# Patient Record
Sex: Male | Born: 1970 | Race: Black or African American | Hispanic: No | Marital: Married | Smoking: Never smoker
Health system: Southern US, Community
[De-identification: ages and names within clinical notes are randomized; demographics above are authoritative.]

## PROBLEM LIST (undated history)

## (undated) DIAGNOSIS — K8020 Calculus of gallbladder without cholecystitis without obstruction: Secondary | ICD-10-CM

## (undated) DIAGNOSIS — K5730 Diverticulosis of large intestine without perforation or abscess without bleeding: Secondary | ICD-10-CM

## (undated) DIAGNOSIS — K6289 Other specified diseases of anus and rectum: Secondary | ICD-10-CM

## (undated) DIAGNOSIS — K2970 Gastritis, unspecified, without bleeding: Secondary | ICD-10-CM

## (undated) DIAGNOSIS — R079 Chest pain, unspecified: Secondary | ICD-10-CM

## (undated) DIAGNOSIS — Z1289 Encounter for screening for malignant neoplasm of other sites: Secondary | ICD-10-CM

## (undated) DIAGNOSIS — K2990 Gastroduodenitis, unspecified, without bleeding: Secondary | ICD-10-CM

## (undated) DIAGNOSIS — G4730 Sleep apnea, unspecified: Secondary | ICD-10-CM

## (undated) DIAGNOSIS — M79609 Pain in unspecified limb: Secondary | ICD-10-CM

## (undated) DIAGNOSIS — F411 Generalized anxiety disorder: Secondary | ICD-10-CM

## (undated) DIAGNOSIS — D249 Benign neoplasm of unspecified breast: Secondary | ICD-10-CM

## (undated) DIAGNOSIS — E669 Obesity, unspecified: Secondary | ICD-10-CM

## (undated) DIAGNOSIS — M25519 Pain in unspecified shoulder: Secondary | ICD-10-CM

## (undated) DIAGNOSIS — R109 Unspecified abdominal pain: Secondary | ICD-10-CM

## (undated) DIAGNOSIS — J309 Allergic rhinitis, unspecified: Secondary | ICD-10-CM

## (undated) DIAGNOSIS — J069 Acute upper respiratory infection, unspecified: Secondary | ICD-10-CM

## (undated) DIAGNOSIS — I10 Essential (primary) hypertension: Secondary | ICD-10-CM

## (undated) DIAGNOSIS — R55 Syncope and collapse: Secondary | ICD-10-CM

## (undated) DIAGNOSIS — M25579 Pain in unspecified ankle and joints of unspecified foot: Secondary | ICD-10-CM

## (undated) HISTORY — DX: Chest pain, unspecified: R07.9

## (undated) HISTORY — PX: OTHER SURGICAL HISTORY: SHX169

## (undated) HISTORY — DX: Acute upper respiratory infection, unspecified: J06.9

## (undated) HISTORY — DX: Encounter for screening for malignant neoplasm of other sites: Z12.89

## (undated) HISTORY — DX: Diverticulosis of large intestine without perforation or abscess without bleeding: K57.30

## (undated) HISTORY — DX: Gastroduodenitis, unspecified, without bleeding: K29.90

## (undated) HISTORY — DX: Sleep apnea, unspecified: G47.30

## (undated) HISTORY — DX: Calculus of gallbladder without cholecystitis without obstruction: K80.20

## (undated) HISTORY — DX: Pain in unspecified shoulder: M25.519

## (undated) HISTORY — DX: Pain in unspecified ankle and joints of unspecified foot: M25.579

## (undated) HISTORY — DX: Syncope and collapse: R55

## (undated) HISTORY — DX: Pain in unspecified limb: M79.609

## (undated) HISTORY — DX: Unspecified abdominal pain: R10.9

## (undated) HISTORY — DX: Benign neoplasm of unspecified breast: D24.9

## (undated) HISTORY — DX: Allergic rhinitis, unspecified: J30.9

## (undated) HISTORY — DX: Other specified diseases of anus and rectum: K62.89

## (undated) HISTORY — DX: Generalized anxiety disorder: F41.1

## (undated) HISTORY — DX: Essential (primary) hypertension: I10

## (undated) HISTORY — DX: Gastritis, unspecified, without bleeding: K29.70

## (undated) HISTORY — DX: Obesity, unspecified: E66.9

---

## 2001-05-26 ENCOUNTER — Emergency Department (HOSPITAL_COMMUNITY): Admission: EM | Admit: 2001-05-26 | Discharge: 2001-05-26 | Payer: Self-pay | Admitting: Emergency Medicine

## 2001-08-19 ENCOUNTER — Emergency Department (HOSPITAL_COMMUNITY): Admission: EM | Admit: 2001-08-19 | Discharge: 2001-08-19 | Payer: Self-pay | Admitting: Emergency Medicine

## 2002-04-28 ENCOUNTER — Emergency Department (HOSPITAL_COMMUNITY): Admission: EM | Admit: 2002-04-28 | Discharge: 2002-04-28 | Payer: Self-pay | Admitting: Emergency Medicine

## 2002-04-28 ENCOUNTER — Encounter: Payer: Self-pay | Admitting: Emergency Medicine

## 2002-12-17 ENCOUNTER — Encounter: Admission: RE | Admit: 2002-12-17 | Discharge: 2002-12-17 | Payer: Self-pay | Admitting: Family Medicine

## 2003-01-03 ENCOUNTER — Encounter: Admission: RE | Admit: 2003-01-03 | Discharge: 2003-02-28 | Payer: Self-pay | Admitting: Sports Medicine

## 2003-02-25 ENCOUNTER — Encounter: Admission: RE | Admit: 2003-02-25 | Discharge: 2003-02-25 | Payer: Self-pay | Admitting: Family Medicine

## 2003-07-01 ENCOUNTER — Encounter: Admission: RE | Admit: 2003-07-01 | Discharge: 2003-07-01 | Payer: Self-pay | Admitting: Sports Medicine

## 2003-09-03 ENCOUNTER — Encounter: Admission: RE | Admit: 2003-09-03 | Discharge: 2003-09-03 | Payer: Self-pay | Admitting: Sports Medicine

## 2003-09-03 HISTORY — PX: MRI: SHX5353

## 2003-10-31 ENCOUNTER — Observation Stay (HOSPITAL_COMMUNITY): Admission: EM | Admit: 2003-10-31 | Discharge: 2003-11-01 | Payer: Self-pay | Admitting: Emergency Medicine

## 2003-11-06 ENCOUNTER — Ambulatory Visit (HOSPITAL_BASED_OUTPATIENT_CLINIC_OR_DEPARTMENT_OTHER): Admission: RE | Admit: 2003-11-06 | Discharge: 2003-11-06 | Payer: Self-pay | Admitting: Internal Medicine

## 2003-11-12 ENCOUNTER — Ambulatory Visit (HOSPITAL_COMMUNITY): Admission: RE | Admit: 2003-11-12 | Discharge: 2003-11-12 | Payer: Self-pay | Admitting: Cardiology

## 2004-01-13 ENCOUNTER — Ambulatory Visit: Payer: Self-pay | Admitting: Internal Medicine

## 2004-08-04 HISTORY — PX: CT SCAN: SHX5351

## 2004-09-04 HISTORY — PX: OTHER SURGICAL HISTORY: SHX169

## 2004-09-04 HISTORY — PX: COLONOSCOPY: SHX174

## 2004-09-04 HISTORY — PX: ESOPHAGOGASTRODUODENOSCOPY: SHX1529

## 2004-09-04 HISTORY — PX: CHOLECYSTECTOMY: SHX55

## 2004-09-11 ENCOUNTER — Emergency Department (HOSPITAL_COMMUNITY): Admission: EM | Admit: 2004-09-11 | Discharge: 2004-09-11 | Payer: Self-pay | Admitting: Emergency Medicine

## 2004-09-13 ENCOUNTER — Emergency Department (HOSPITAL_COMMUNITY): Admission: EM | Admit: 2004-09-13 | Discharge: 2004-09-13 | Payer: Self-pay | Admitting: Emergency Medicine

## 2004-09-19 ENCOUNTER — Observation Stay (HOSPITAL_COMMUNITY): Admission: EM | Admit: 2004-09-19 | Discharge: 2004-09-21 | Payer: Self-pay | Admitting: Emergency Medicine

## 2004-09-25 ENCOUNTER — Inpatient Hospital Stay (HOSPITAL_COMMUNITY): Admission: EM | Admit: 2004-09-25 | Discharge: 2004-10-01 | Payer: Self-pay | Admitting: Emergency Medicine

## 2004-09-30 ENCOUNTER — Encounter (INDEPENDENT_AMBULATORY_CARE_PROVIDER_SITE_OTHER): Payer: Self-pay | Admitting: *Deleted

## 2004-10-12 ENCOUNTER — Ambulatory Visit: Payer: Self-pay | Admitting: Family Medicine

## 2004-10-16 ENCOUNTER — Emergency Department (HOSPITAL_COMMUNITY): Admission: EM | Admit: 2004-10-16 | Discharge: 2004-10-16 | Payer: Self-pay | Admitting: Emergency Medicine

## 2004-10-21 ENCOUNTER — Encounter (INDEPENDENT_AMBULATORY_CARE_PROVIDER_SITE_OTHER): Payer: Self-pay | Admitting: *Deleted

## 2004-10-21 ENCOUNTER — Ambulatory Visit (HOSPITAL_COMMUNITY): Admission: RE | Admit: 2004-10-21 | Discharge: 2004-10-22 | Payer: Self-pay | Admitting: Surgery

## 2004-12-16 ENCOUNTER — Ambulatory Visit: Payer: Self-pay | Admitting: Family Medicine

## 2005-02-01 ENCOUNTER — Ambulatory Visit: Payer: Self-pay | Admitting: Family Medicine

## 2005-10-04 ENCOUNTER — Ambulatory Visit (HOSPITAL_COMMUNITY): Admission: RE | Admit: 2005-10-04 | Discharge: 2005-10-04 | Payer: Self-pay | Admitting: Family Medicine

## 2005-10-04 ENCOUNTER — Ambulatory Visit: Payer: Self-pay | Admitting: Internal Medicine

## 2005-10-06 ENCOUNTER — Ambulatory Visit: Payer: Self-pay | Admitting: Family Medicine

## 2005-10-11 ENCOUNTER — Ambulatory Visit: Payer: Self-pay | Admitting: Family Medicine

## 2005-10-12 ENCOUNTER — Ambulatory Visit: Payer: Self-pay | Admitting: Gastroenterology

## 2005-10-22 ENCOUNTER — Ambulatory Visit: Payer: Self-pay | Admitting: Family Medicine

## 2005-10-22 ENCOUNTER — Emergency Department (HOSPITAL_COMMUNITY): Admission: EM | Admit: 2005-10-22 | Discharge: 2005-10-22 | Payer: Self-pay | Admitting: Emergency Medicine

## 2005-10-28 ENCOUNTER — Ambulatory Visit: Payer: Self-pay | Admitting: Gastroenterology

## 2005-10-29 ENCOUNTER — Ambulatory Visit (HOSPITAL_COMMUNITY): Admission: RE | Admit: 2005-10-29 | Discharge: 2005-10-29 | Payer: Self-pay | Admitting: Gastroenterology

## 2005-11-03 ENCOUNTER — Ambulatory Visit: Payer: Self-pay | Admitting: Gastroenterology

## 2005-11-08 ENCOUNTER — Ambulatory Visit: Payer: Self-pay | Admitting: Gastroenterology

## 2005-11-08 ENCOUNTER — Ambulatory Visit (HOSPITAL_COMMUNITY): Admission: RE | Admit: 2005-11-08 | Discharge: 2005-11-08 | Payer: Self-pay | Admitting: Gastroenterology

## 2005-11-08 LAB — CONVERTED CEMR LAB
OCCULT 1: NEGATIVE
OCCULT 2: NEGATIVE
OCCULT 5: NEGATIVE

## 2005-11-16 ENCOUNTER — Ambulatory Visit: Payer: Self-pay | Admitting: Gastroenterology

## 2005-11-19 ENCOUNTER — Ambulatory Visit: Payer: Self-pay | Admitting: Psychology

## 2005-11-23 ENCOUNTER — Ambulatory Visit: Payer: Self-pay | Admitting: Internal Medicine

## 2005-11-26 ENCOUNTER — Ambulatory Visit: Payer: Self-pay | Admitting: Family Medicine

## 2005-11-30 ENCOUNTER — Ambulatory Visit: Payer: Self-pay | Admitting: Family Medicine

## 2005-12-02 ENCOUNTER — Ambulatory Visit: Payer: Self-pay | Admitting: Psychology

## 2005-12-06 ENCOUNTER — Ambulatory Visit: Payer: Self-pay | Admitting: Gastroenterology

## 2005-12-08 ENCOUNTER — Ambulatory Visit: Payer: Self-pay | Admitting: Psychology

## 2006-01-10 ENCOUNTER — Ambulatory Visit: Payer: Self-pay | Admitting: Psychology

## 2006-01-24 ENCOUNTER — Ambulatory Visit: Payer: Self-pay | Admitting: Internal Medicine

## 2006-01-24 ENCOUNTER — Ambulatory Visit: Payer: Self-pay | Admitting: Psychology

## 2006-07-29 DIAGNOSIS — I10 Essential (primary) hypertension: Secondary | ICD-10-CM | POA: Insufficient documentation

## 2006-07-29 DIAGNOSIS — K8020 Calculus of gallbladder without cholecystitis without obstruction: Secondary | ICD-10-CM | POA: Insufficient documentation

## 2006-07-29 DIAGNOSIS — G4730 Sleep apnea, unspecified: Secondary | ICD-10-CM | POA: Insufficient documentation

## 2006-07-29 DIAGNOSIS — R079 Chest pain, unspecified: Secondary | ICD-10-CM

## 2006-07-29 DIAGNOSIS — K5730 Diverticulosis of large intestine without perforation or abscess without bleeding: Secondary | ICD-10-CM | POA: Insufficient documentation

## 2006-08-04 ENCOUNTER — Encounter (INDEPENDENT_AMBULATORY_CARE_PROVIDER_SITE_OTHER): Payer: Self-pay | Admitting: *Deleted

## 2006-10-15 IMAGING — US US ABDOMEN COMPLETE
1 series · 14 of 25 positions shown · non-contrast
Comparison: Previous plain films 09/19/04 and CT abdomen and pelvis 09/19/04.

CLINICAL DATA: Abdominal pain.  Right lower quadrant pain.  History of hypertension.   History of vomiting. 
ABDOMEN ULTRASOUND:
TECHNIQUE: Complete abdominal ultrasound examination was performed including evaluation of the liver, gallbladder, bile ducts, pancreas, kidneys, spleen, IVC, and abdominal aorta.

[Series 1: unknown · 0.38mm/px · 14 of 83 slices shown]
[im 1/83]
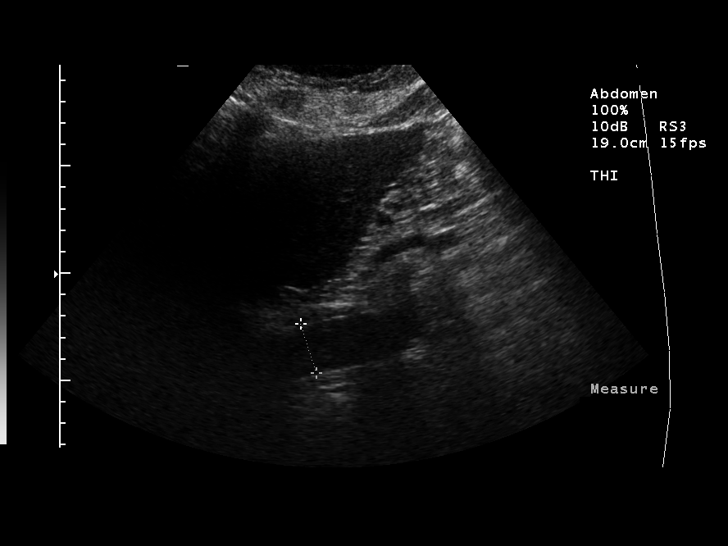
[im 7/83]
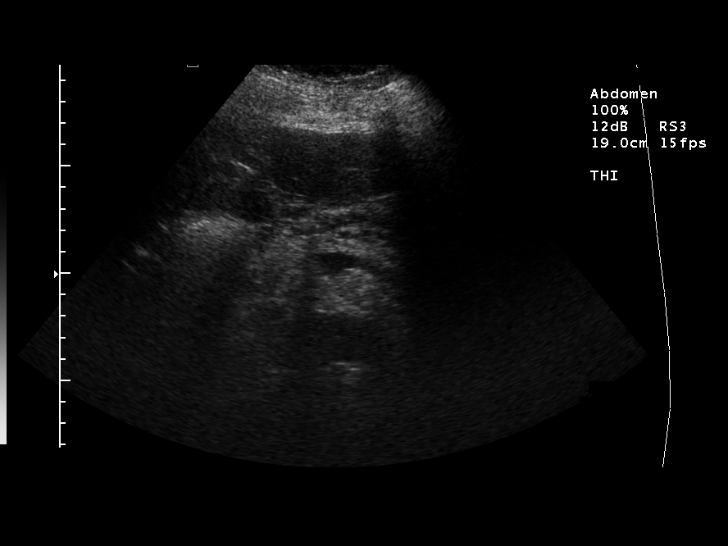
[im 14/83]
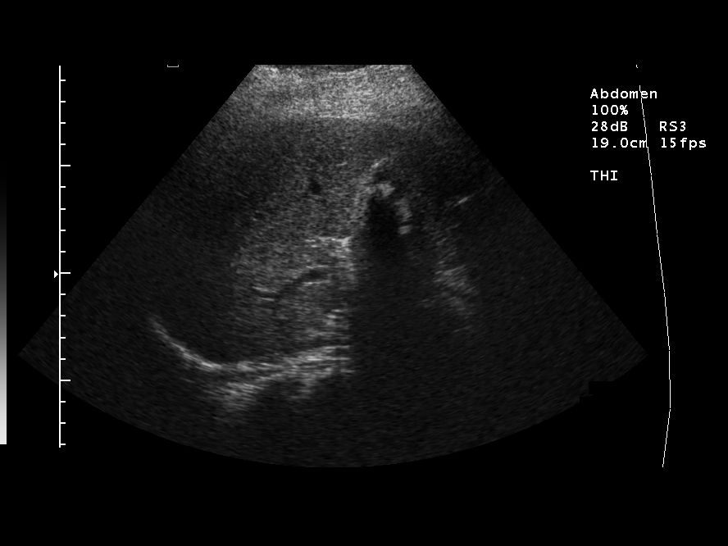
[im 21/83]
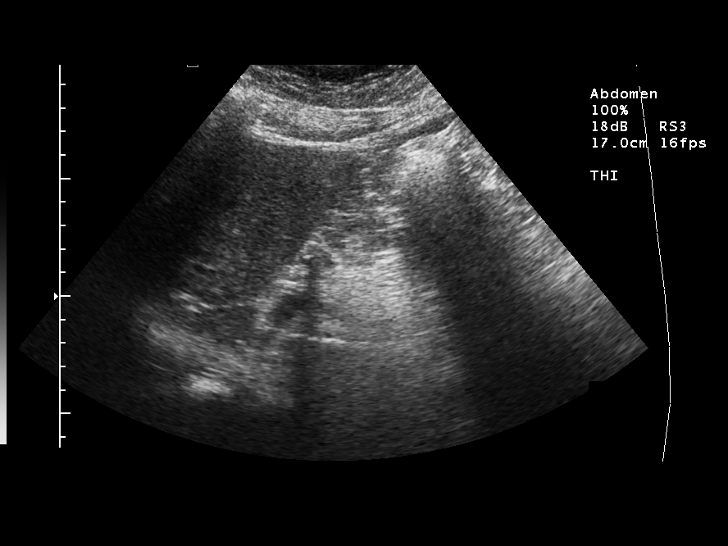
[im 28/83]
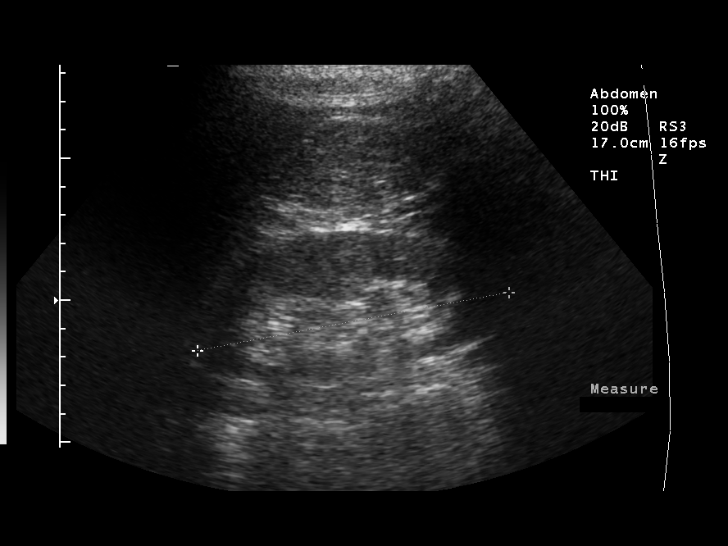
[im 31/83]
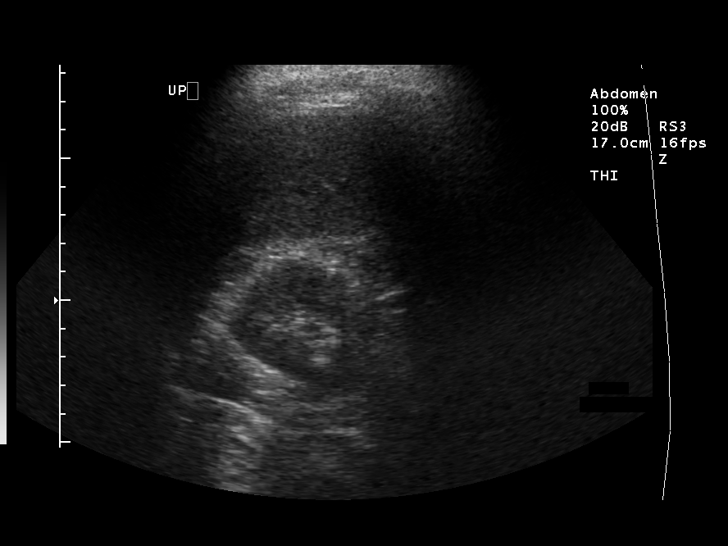
[im 38/83]
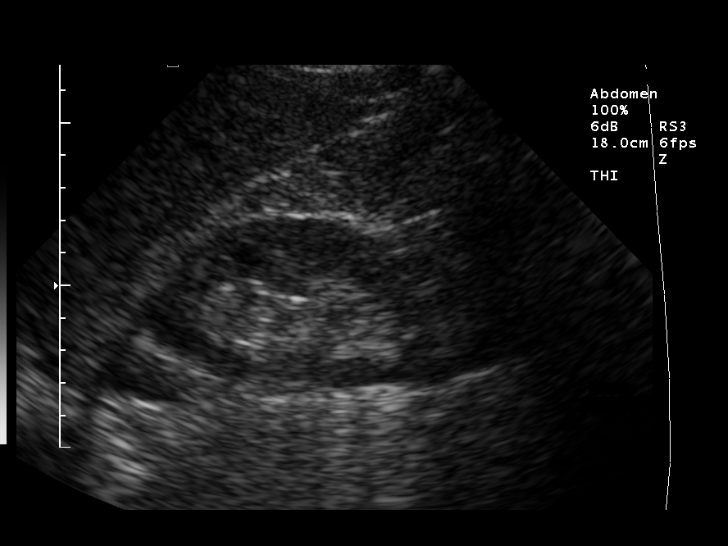
[im 45/83]
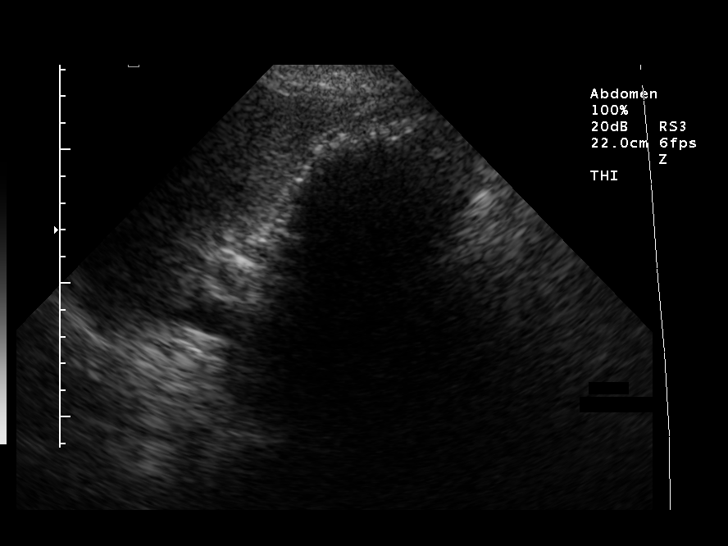
[im 52/83]
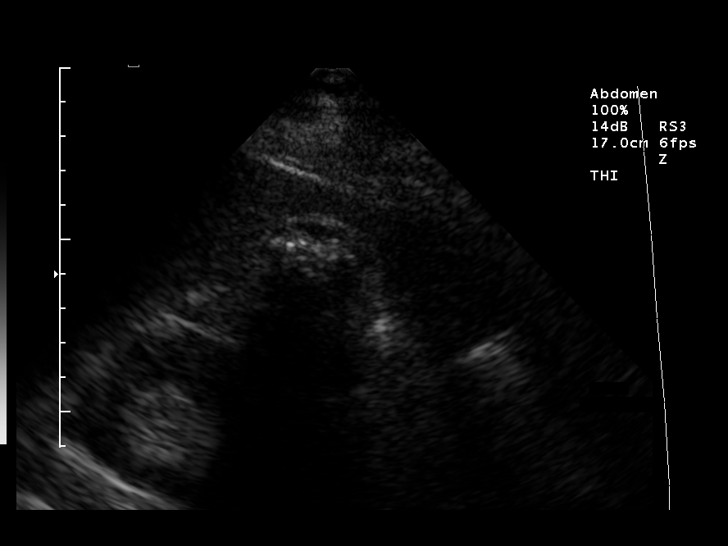
[im 55/83]
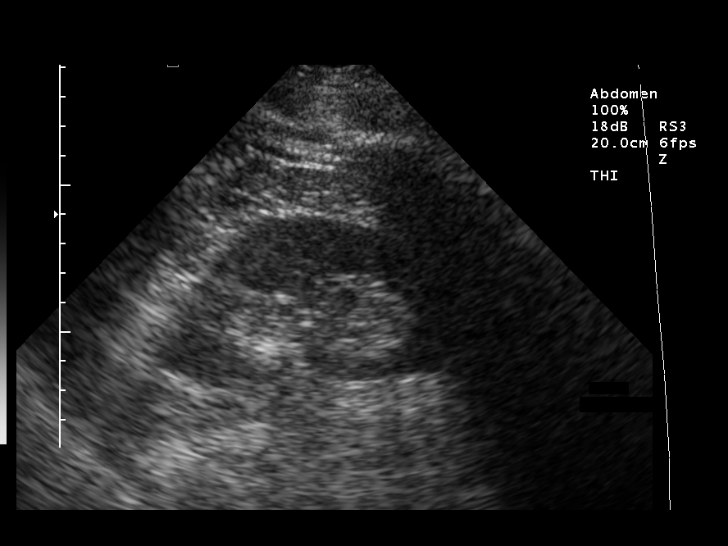
[im 62/83]
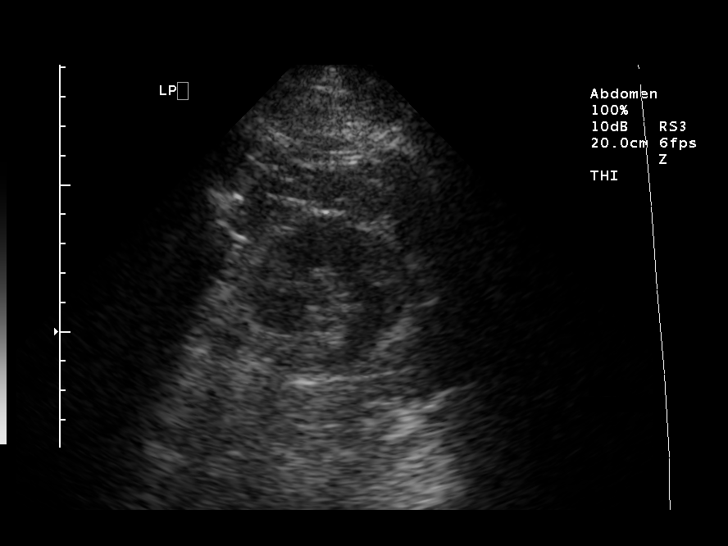
[im 69/83]
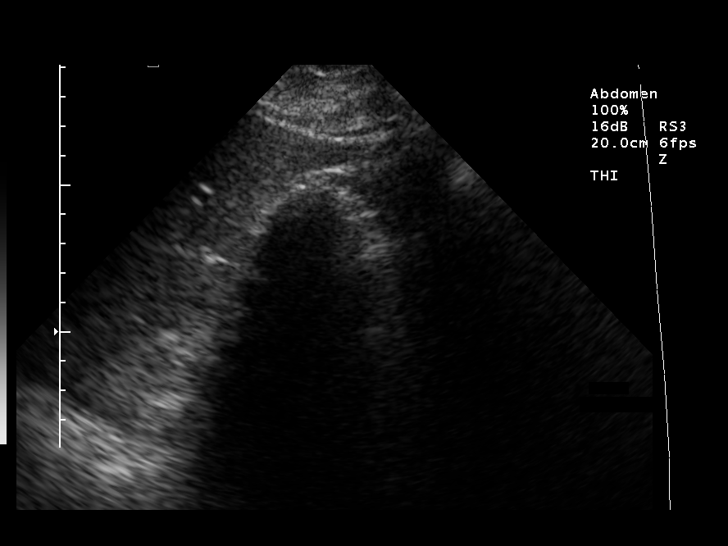
[im 76/83]
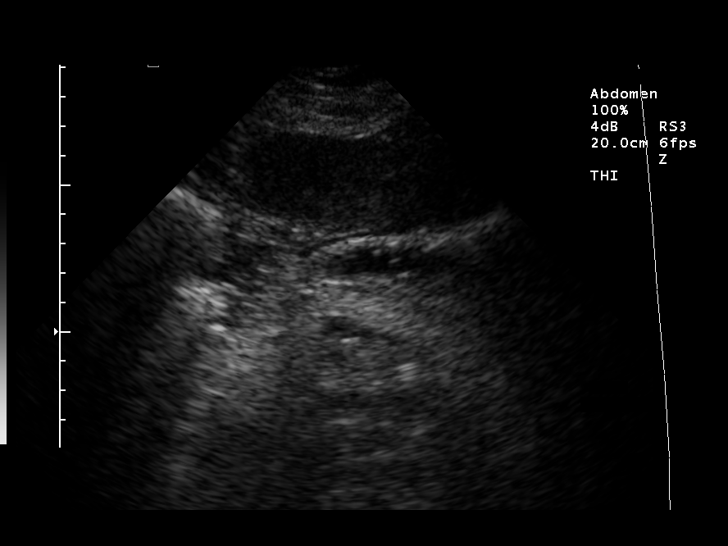
[im 83/83]
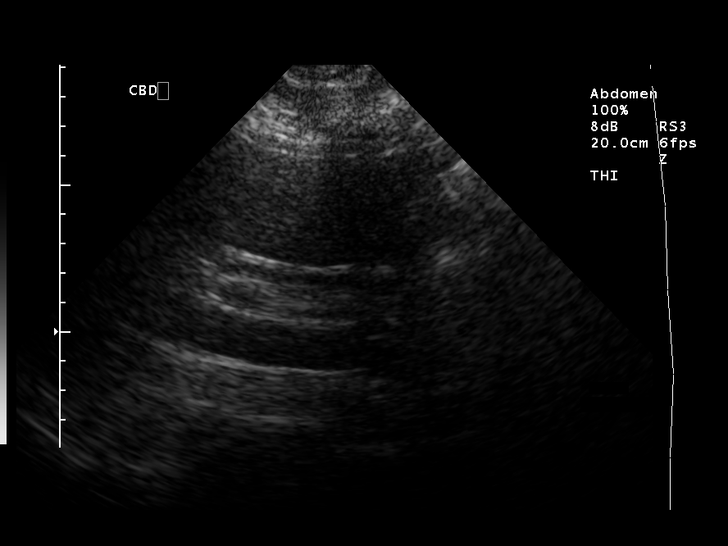

[14 of 25 positions shown; findings below may reference images not displayed]

FINDINGS: Liver is normal in size and echotexture without masses or dilated ducts.  The gallbladder contains numerous stones with wall thickness within normal limits of 2.9 mm.  Common bile duct is normal measuring 4.1 mm.  The IVC and pancreas were normal.  Spleen was normal in size with a length of 8.3 cm and no focal abnormalities.  The kidneys were normal in size and echotexture without masses, hydronephrosis, or calcifications.  There is a small subcentimeter cyst in the midportion of the right kidney.  The abdominal aorta was normal measuring 2.4 cm in diameter.
IMPRESSION: Cholelithiasis without definite ultrasonographic signs of acute cholecystitis or biliary ductal dilatation.

## 2007-01-18 ENCOUNTER — Ambulatory Visit: Payer: Self-pay | Admitting: Family Medicine

## 2007-01-18 DIAGNOSIS — E669 Obesity, unspecified: Secondary | ICD-10-CM | POA: Insufficient documentation

## 2007-01-18 DIAGNOSIS — M25579 Pain in unspecified ankle and joints of unspecified foot: Secondary | ICD-10-CM

## 2007-02-03 ENCOUNTER — Ambulatory Visit: Payer: Self-pay | Admitting: Family Medicine

## 2007-02-03 ENCOUNTER — Encounter (INDEPENDENT_AMBULATORY_CARE_PROVIDER_SITE_OTHER): Payer: Self-pay | Admitting: Internal Medicine

## 2007-02-03 DIAGNOSIS — M79609 Pain in unspecified limb: Secondary | ICD-10-CM

## 2007-02-09 ENCOUNTER — Telehealth (INDEPENDENT_AMBULATORY_CARE_PROVIDER_SITE_OTHER): Payer: Self-pay | Admitting: Internal Medicine

## 2007-02-21 ENCOUNTER — Encounter (INDEPENDENT_AMBULATORY_CARE_PROVIDER_SITE_OTHER): Payer: Self-pay | Admitting: *Deleted

## 2007-03-21 ENCOUNTER — Ambulatory Visit: Payer: Self-pay | Admitting: Family Medicine

## 2007-04-10 LAB — CONVERTED CEMR LAB
ALT: 13 units/L (ref 0–53)
AST: 13 units/L (ref 0–37)
Alkaline Phosphatase: 51 units/L (ref 39–117)
BUN: 12 mg/dL (ref 6–23)
Basophils Relative: 0.4 % (ref 0.0–1.0)
Calcium: 8.9 mg/dL (ref 8.4–10.5)
Chloride: 107 meq/L (ref 96–112)
Eosinophils Absolute: 0.1 10*3/uL (ref 0.0–0.6)
GFR calc Af Amer: 64 mL/min
GFR calc non Af Amer: 53 mL/min
HDL: 40.6 mg/dL (ref >39.0)
Lymphocytes Relative: 47.6 % — ABNORMAL HIGH (ref 12.0–46.0)
Monocytes Relative: 9.6 % (ref 3.0–11.0)
Neutro Abs: 2.4 10*3/uL (ref 1.4–7.7)
Platelets: 388 10*3/uL (ref 150–400)
RBC: 4.22 M/uL (ref 4.22–5.81)
Total CHOL/HDL Ratio: 3.2
Triglycerides: 73 mg/dL (ref 0–149)
VLDL: 15 mg/dL (ref 0–40)
WBC: 5.9 10*3/uL (ref 4.5–10.5)

## 2007-05-26 ENCOUNTER — Ambulatory Visit: Payer: Self-pay | Admitting: Family Medicine

## 2007-05-26 DIAGNOSIS — K6289 Other specified diseases of anus and rectum: Secondary | ICD-10-CM

## 2007-05-26 DIAGNOSIS — R55 Syncope and collapse: Secondary | ICD-10-CM

## 2007-06-02 ENCOUNTER — Ambulatory Visit: Payer: Self-pay | Admitting: Family Medicine

## 2007-06-05 ENCOUNTER — Ambulatory Visit: Payer: Self-pay | Admitting: Family Medicine

## 2007-06-08 ENCOUNTER — Encounter (INDEPENDENT_AMBULATORY_CARE_PROVIDER_SITE_OTHER): Payer: Self-pay | Admitting: Internal Medicine

## 2007-06-08 ENCOUNTER — Encounter: Payer: Self-pay | Admitting: Family Medicine

## 2007-06-08 LAB — CONVERTED CEMR LAB: OCCULT 3: NEGATIVE

## 2007-09-27 ENCOUNTER — Encounter: Payer: Self-pay | Admitting: Family Medicine

## 2007-09-27 ENCOUNTER — Encounter: Payer: Self-pay | Admitting: Cardiovascular Disease

## 2007-09-28 ENCOUNTER — Emergency Department (HOSPITAL_COMMUNITY): Admission: EM | Admit: 2007-09-28 | Discharge: 2007-09-28 | Payer: Self-pay | Admitting: Emergency Medicine

## 2007-09-28 ENCOUNTER — Ambulatory Visit: Payer: Self-pay | Admitting: Family Medicine

## 2007-09-28 ENCOUNTER — Telehealth: Payer: Self-pay | Admitting: Family Medicine

## 2007-09-28 DIAGNOSIS — J069 Acute upper respiratory infection, unspecified: Secondary | ICD-10-CM | POA: Insufficient documentation

## 2007-09-28 DIAGNOSIS — R109 Unspecified abdominal pain: Secondary | ICD-10-CM | POA: Insufficient documentation

## 2007-10-02 ENCOUNTER — Telehealth (INDEPENDENT_AMBULATORY_CARE_PROVIDER_SITE_OTHER): Payer: Self-pay | Admitting: *Deleted

## 2007-10-03 ENCOUNTER — Ambulatory Visit: Payer: Self-pay

## 2007-10-03 ENCOUNTER — Encounter: Payer: Self-pay | Admitting: Family Medicine

## 2007-10-04 ENCOUNTER — Telehealth: Payer: Self-pay | Admitting: Family Medicine

## 2007-10-05 ENCOUNTER — Encounter: Payer: Self-pay | Admitting: Family Medicine

## 2007-10-06 ENCOUNTER — Ambulatory Visit: Payer: Self-pay | Admitting: Cardiovascular Disease

## 2007-10-06 ENCOUNTER — Encounter: Payer: Self-pay | Admitting: Family Medicine

## 2007-10-06 LAB — CONVERTED CEMR LAB
CO2: 19 meq/L (ref 19–32)
Calcium: 8.6 mg/dL (ref 8.4–10.5)
Chloride: 108 meq/L (ref 96–112)
HCT: 36.1 % — ABNORMAL LOW (ref 39.0–52.0)
Hemoglobin: 11.9 g/dL — ABNORMAL LOW (ref 13.0–17.0)
Potassium: 4 meq/L (ref 3.5–5.3)
RBC: 4.42 M/uL (ref 4.22–5.81)
Sodium: 139 meq/L (ref 135–145)
WBC: 6.3 10*3/uL (ref 4.0–10.5)
aPTT: 34 s (ref 24–37)

## 2007-10-10 ENCOUNTER — Inpatient Hospital Stay (HOSPITAL_BASED_OUTPATIENT_CLINIC_OR_DEPARTMENT_OTHER): Admission: RE | Admit: 2007-10-10 | Discharge: 2007-10-10 | Payer: Self-pay | Admitting: Cardiovascular Disease

## 2007-10-10 ENCOUNTER — Ambulatory Visit: Payer: Self-pay | Admitting: Cardiovascular Disease

## 2007-10-10 HISTORY — PX: CARDIAC CATHETERIZATION: SHX172

## 2007-10-17 ENCOUNTER — Telehealth: Payer: Self-pay | Admitting: Family Medicine

## 2007-10-20 ENCOUNTER — Telehealth (INDEPENDENT_AMBULATORY_CARE_PROVIDER_SITE_OTHER): Payer: Self-pay | Admitting: *Deleted

## 2007-10-31 ENCOUNTER — Ambulatory Visit: Payer: Self-pay

## 2007-10-31 ENCOUNTER — Encounter: Payer: Self-pay | Admitting: Cardiovascular Disease

## 2007-10-31 ENCOUNTER — Ambulatory Visit: Payer: Self-pay | Admitting: Cardiovascular Disease

## 2007-11-15 ENCOUNTER — Ambulatory Visit: Payer: Self-pay | Admitting: Family Medicine

## 2007-11-15 DIAGNOSIS — F411 Generalized anxiety disorder: Secondary | ICD-10-CM | POA: Insufficient documentation

## 2007-11-21 ENCOUNTER — Telehealth (INDEPENDENT_AMBULATORY_CARE_PROVIDER_SITE_OTHER): Payer: Self-pay | Admitting: Internal Medicine

## 2008-03-28 ENCOUNTER — Ambulatory Visit: Payer: Self-pay | Admitting: Family Medicine

## 2008-03-28 DIAGNOSIS — J309 Allergic rhinitis, unspecified: Secondary | ICD-10-CM | POA: Insufficient documentation

## 2008-04-08 ENCOUNTER — Telehealth (INDEPENDENT_AMBULATORY_CARE_PROVIDER_SITE_OTHER): Payer: Self-pay | Admitting: Internal Medicine

## 2008-05-21 ENCOUNTER — Ambulatory Visit: Payer: Self-pay | Admitting: Family Medicine

## 2008-05-21 ENCOUNTER — Telehealth (INDEPENDENT_AMBULATORY_CARE_PROVIDER_SITE_OTHER): Payer: Self-pay | Admitting: Internal Medicine

## 2008-05-21 DIAGNOSIS — M25519 Pain in unspecified shoulder: Secondary | ICD-10-CM

## 2008-06-11 ENCOUNTER — Telehealth (INDEPENDENT_AMBULATORY_CARE_PROVIDER_SITE_OTHER): Payer: Self-pay | Admitting: Internal Medicine

## 2008-06-24 ENCOUNTER — Telehealth: Payer: Self-pay | Admitting: Cardiovascular Disease

## 2008-07-04 ENCOUNTER — Ambulatory Visit: Payer: Self-pay | Admitting: Family Medicine

## 2008-07-04 DIAGNOSIS — M25569 Pain in unspecified knee: Secondary | ICD-10-CM | POA: Insufficient documentation

## 2008-07-12 ENCOUNTER — Encounter (INDEPENDENT_AMBULATORY_CARE_PROVIDER_SITE_OTHER): Payer: Self-pay | Admitting: Internal Medicine

## 2008-08-07 ENCOUNTER — Encounter (INDEPENDENT_AMBULATORY_CARE_PROVIDER_SITE_OTHER): Payer: Self-pay | Admitting: Internal Medicine

## 2008-09-29 ENCOUNTER — Encounter: Admission: RE | Admit: 2008-09-29 | Discharge: 2008-09-29 | Payer: Self-pay | Admitting: Orthopaedic Surgery

## 2008-10-02 ENCOUNTER — Encounter (INDEPENDENT_AMBULATORY_CARE_PROVIDER_SITE_OTHER): Payer: Self-pay | Admitting: Internal Medicine

## 2008-10-14 ENCOUNTER — Telehealth (INDEPENDENT_AMBULATORY_CARE_PROVIDER_SITE_OTHER): Payer: Self-pay | Admitting: *Deleted

## 2008-11-19 ENCOUNTER — Ambulatory Visit (HOSPITAL_COMMUNITY): Admission: RE | Admit: 2008-11-19 | Discharge: 2008-11-19 | Payer: Self-pay | Admitting: Orthopaedic Surgery

## 2008-11-19 HISTORY — PX: SHOULDER ARTHROSCOPY: SHX128

## 2008-12-02 ENCOUNTER — Encounter (INDEPENDENT_AMBULATORY_CARE_PROVIDER_SITE_OTHER): Payer: Self-pay | Admitting: Internal Medicine

## 2008-12-18 ENCOUNTER — Encounter (INDEPENDENT_AMBULATORY_CARE_PROVIDER_SITE_OTHER): Payer: Self-pay | Admitting: Internal Medicine

## 2009-04-16 ENCOUNTER — Ambulatory Visit: Payer: Self-pay | Admitting: Family Medicine

## 2009-04-20 ENCOUNTER — Emergency Department (HOSPITAL_COMMUNITY): Admission: EM | Admit: 2009-04-20 | Discharge: 2009-04-20 | Payer: Self-pay | Admitting: Emergency Medicine

## 2009-07-29 ENCOUNTER — Emergency Department (HOSPITAL_COMMUNITY): Admission: EM | Admit: 2009-07-29 | Discharge: 2009-07-30 | Payer: Self-pay | Admitting: Emergency Medicine

## 2010-01-25 ENCOUNTER — Encounter: Payer: Self-pay | Admitting: Family Medicine

## 2010-02-03 NOTE — Assessment & Plan Note (Signed)
Summary: EYES,SINUS PRESSURE/CLE   Vital Signs:  Patient profile:   40 year old male Weight:      292.75 pounds BMI:     37.72 Temp:     98.5 degrees F oral Pulse rate:   80 / minute Pulse rhythm:   regular BP sitting:   150 / 100  (left arm) Cuff size:   large  Vitals Entered By: Sydell Axon LPN (April 16, 2009 2:50 PM) CC: Eyes red and itchy, sinus pressure and post nasal drip   History of Present Illness: Pt here for heavy crust in the eyes bilat this AM. He has felt warm with no chills, He has headache with forehad pressure, some PND not real rhinitis, clear, no SOB, no Cough. He has taken Robitussin Plain and Advil and Tyl for headache.   Problems Prior to Update: 1)  Knee Pain, Right  (ICD-719.46) 2)  Chest Pain  (ICD-786.50) 3)  Hypertension  (ICD-401.9) 4)  Syncope  (ICD-780.2) 5)  Obesity  (ICD-278.00) 6)  Sleep Apnea  (ICD-780.57) 7)  Shoulder Pain, Right  (ICD-719.41) 8)  Allergic Rhinitis Cause Unspecified  (ICD-477.9) 9)  Anxiety  (ICD-300.00) 10)  Abdominal Pain  (ICD-789.00) 11)  Uri  (ICD-465.9) 12)  Rectal Pain  (ICD-569.42) 13)  Foot Pain, Left  (ICD-729.5) 14)  Ankle Pain, Left  (ICD-719.47) 15)  Cholelithiasis  (ICD-574.20) 16)  Gastritis, Mild  (ICD-535.50) 17)  Diverticulosis, Colon  (ICD-562.10) 18)  Special Screening Malig Neoplasms Other Sites  (ICD-V76.49)  Medications Prior to Update: 1)  Lisinopril 20 Mg Tabs (Lisinopril) .Marland Kitchen.. 1 Once Daily For Bp By Mouth 2)  Tylenol Extra Strength 500 Mg Tabs (Acetaminophen) .... As Needed 3)  Allegra 180 Mg Tabs (Fexofenadine Hcl) .Marland Kitchen.. 1 Once Daily For Allergic Rhiniotis As Needed 4)  Advil 200 Mg Tabs (Ibuprofen) .... Otc As Directed  Allergies: 1)  ! Morphine Sulfate Cr (Morphine Sulfate) 2)  ! Dilaudid (Hydromorphone Hcl)  Physical Exam  General:  alert, well-developed, well-nourished, well-hydrated, and overweight-appearing, mildly congested..   Head:  Normocephalic and atraumatic without  obvious abnormalities. No apparent alopecia or balding. Sinuses NT. Eyes:  Inflamed palpebral conj. bilat. Ears:  External ear exam shows no significant lesions or deformities.  Otoscopic examination reveals clear canals, tympanic membranes are intact bilaterally without bulging, retraction, inflammation or discharge. Hearing is grossly normal bilaterally. Nose:  External nasal examination shows no deformity or inflammation. Nasal mucosa are pink and moist without lesions or exudates. Mouth:  no exudates and pharyngeal erythema.   Neck:  No deformities, masses, or tenderness noted. Lungs:  moist cough, no crackles and no wheezes.   Heart:  normal rate, regular rhythm, and no murmur.     Impression & Recommendations:  Problem # 1:  ALLERGIC RHINITIS CAUSE UNSPECIFIED (ICD-477.9) Assessment Deteriorated  See instructions. The following medications were removed from the medication list:    Allegra 180 Mg Tabs (Fexofenadine hcl) .Marland Kitchen... 1 once daily for allergic rhiniotis as needed His updated medication list for this problem includes:    Nasonex 50 Mcg/act Susp (Mometasone furoate) .Marland Kitchen... 2 squirts each nostr two times a day as needed allergies  Discussed use of allergy medications and environmental measures.   Complete Medication List: 1)  Lisinopril 20 Mg Tabs (Lisinopril) .Marland Kitchen.. 1 once daily for bp by mouth 2)  Tylenol Extra Strength 500 Mg Tabs (Acetaminophen) .... As needed 3)  Advil 200 Mg Tabs (Ibuprofen) .... Otc as directed 4)  Nasonex 50 Mcg/act Susp (Mometasone furoate) .Marland KitchenMarland KitchenMarland Kitchen  2 squirts each nostr two times a day as needed allergies 5)  Patanol 0.1 % Soln (Olopatadine hcl) .Marland Kitchen.. 1 drop each eye daily as needed allergies  Patient Instructions: 1)  For congestion, Take Guaifenesin by going to [facility] or [facility] and getting MUCOUS RELIEF EXPECTORANT (400mg ), take 11/2 tabs by mouth AM and NOON. 2)  Drink lots of fluids anytime taking Guaifenesin.  3)  Use Zyrtec 10mg   at night. 4)  Add Nasonex in a few days if not controllwed. 5)  Add Patanol in one week if not controlled. Prescriptions: NASONEX 50 MCG/ACT SUSP (MOMETASONE FUROATE) 2 squirts each nostr two times a day as needed allergies  #1 MDI x 12   Entered and Authorized by:   Shaune Leeks MD   Signed by:   Shaune Leeks MD on 04/16/2009   Method used:   Print then Give to Patient   RxID:   [id number] PATANOL 0.1 % SOLN (OLOPATADINE HCL) 1 drop each eye daily as needed allergies  #1 MDI x 12   Entered and Authorized by:   Shaune Leeks MD   Signed by:   Shaune Leeks MD on 04/16/2009   Method used:   Print then Give to Patient   RxID:   [id number]   Current Allergies (reviewed today): ! MORPHINE SULFATE CR (MORPHINE SULFATE) ! DILAUDID (HYDROMORPHONE HCL)

## 2010-03-21 LAB — COMPREHENSIVE METABOLIC PANEL
BUN: 12 mg/dL (ref 6–23)
CO2: 29 mEq/L (ref 19–32)
Calcium: 9 mg/dL (ref 8.4–10.5)
Creatinine, Ser: 1.7 mg/dL — ABNORMAL HIGH (ref 0.4–1.5)
GFR calc non Af Amer: 46 mL/min — ABNORMAL LOW (ref >60)
Glucose, Bld: 102 mg/dL — ABNORMAL HIGH (ref 70–99)
Total Protein: 7.6 g/dL (ref 6.0–8.3)

## 2010-03-21 LAB — URINALYSIS, ROUTINE W REFLEX MICROSCOPIC
Bilirubin Urine: NEGATIVE
Glucose, UA: NEGATIVE mg/dL
Hgb urine dipstick: NEGATIVE
Ketones, ur: NEGATIVE mg/dL
Nitrite: NEGATIVE
Protein, ur: NEGATIVE mg/dL
Specific Gravity, Urine: 1.021 (ref 1.005–1.030)
Urobilinogen, UA: 0.2 mg/dL (ref 0.0–1.0)
pH: 6.5 (ref 5.0–8.0)

## 2010-03-21 LAB — CBC
HCT: 36.8 % — ABNORMAL LOW (ref 39.0–52.0)
Hemoglobin: 12.4 g/dL — ABNORMAL LOW (ref 13.0–17.0)
MCHC: 33.8 g/dL (ref 30.0–36.0)
RDW: 15.9 % — ABNORMAL HIGH (ref 11.5–15.5)
WBC: 7.2 10*3/uL (ref 4.0–10.5)

## 2010-03-21 LAB — DIFFERENTIAL
Basophils Absolute: 0 10*3/uL (ref 0.0–0.1)
Basophils Relative: 1 % (ref 0–1)
Lymphocytes Relative: 48 % — ABNORMAL HIGH (ref 12–46)
Monocytes Absolute: 0.5 10*3/uL (ref 0.1–1.0)
Monocytes Relative: 7 % (ref 3–12)
Neutro Abs: 3.1 10*3/uL (ref 1.7–7.7)
Neutrophils Relative %: 43 % (ref 43–77)

## 2010-03-21 LAB — LIPASE, BLOOD: Lipase: 28 U/L (ref 11–59)

## 2010-04-08 LAB — CBC
HCT: 36 % — ABNORMAL LOW (ref 39.0–52.0)
Hemoglobin: 12.2 g/dL — ABNORMAL LOW (ref 13.0–17.0)
MCV: 82.2 fL (ref 78.0–100.0)
WBC: 6.8 10*3/uL (ref 4.0–10.5)

## 2010-04-08 LAB — BASIC METABOLIC PANEL
BUN: 12 mg/dL (ref 6–23)
Chloride: 106 mEq/L (ref 96–112)
GFR calc non Af Amer: 54 mL/min — ABNORMAL LOW (ref >60)
Potassium: 4.6 mEq/L (ref 3.5–5.1)
Sodium: 139 mEq/L (ref 135–145)

## 2010-05-19 NOTE — Assessment & Plan Note (Signed)
[facility]                            CARDIOLOGY OFFICE NOTE   TESLA, BOCHICCHIO                     MRN:          [id number]  DATE:10/31/2007                            DOB:          Dec 27, 1970    PRIMARY CARE PHYSICIAN:  Karleen Hampshire T. Copland, MD   HISTORY OF PRESENT ILLNESS:  Mr. Bunton is a pleasant 40 year old  African American male with past medical history significant for  hypertension, obesity, and a strong family history of premature coronary  artery disease who was initially seen in our [place] office on  October 06, 2007, with complaints of constant left-sided chest pain with  radiation into his left shoulder and left arm.  When I initially saw the  patient, he explained to me that the pain had been there constantly for  a 2-week period.  He had been seen in the emergency department at [facility] prior to that office visit complaining of fever, chills,  cough, and left-sided chest pain.  A chest x-ray showed no acute disease  in the emergency room.  He underwent a myocardial perfusion stress study  that was ordered by his primary care physician on October 03, 2007.  There were no ischemic EKG changes noted on the stress test.  He stopped  secondary to shortness of breath, arm pain, and chest pain.  There was  normal contractility and thickening in all areas of the myocardium.  There was low normal ejection fraction noted with slight enlargement of  the left ventricular chamber.  There was also questionable areas of  ischemia in the anterior wall and the inferolateral wall at the base.  Because of his abnormal stress test, his symptoms and his strong family  history as well as his obesity and hypertension, I elected to perform a  diagnostic left heart catheterization.  This was performed on October 10, 2007, at [facility].  His heart catheterization showed no  evidence of obstructive coronary artery disease.  The  left main coronary  artery, LAD, circumflex, and right coronary arteries were  angiographically normal.  Left ventricular angiogram demonstrated an  ejection fraction of 40-45%.  His end-diastolic pressure was slightly  elevated at 24.  Prior to his discharge from the outpatient  catheterization laboratory, I elected to increase his Norvasc to 10 mg  once daily.   The patient comes in today for followup of his left heart  catheterization.  He tells me that his constant chest pain has resolved  over the last several weeks.  He has only noted left-sided chest pain  that radiates into his left neck and shoulder when he is in stressful  situations.  This has included arguments with a closed friend.  This  pain is atypical in nature and lasts for several hours.  There are no  associated symptoms of shortness of breath, diaphoresis, nausea,  dizziness, or palpitations.  He has no other complaints at this time.  He tells me that he is currently still working full time and is also  enrolled in school pursuing a degree in business management.  Prior to  seeing me this morning, he had a followup surface echocardiogram, so we  could get another assessment of his left ventricular size and also his  overall ejection fraction.   His past medical history is unchanged and includes hypertension and  obesity.   His current medications include Norvasc 10 mg once daily.  The patient  has also been wearing BiPAP at night.   REVIEW OF SYSTEMS:  As stated in the history of present illness and is  otherwise negative.   PHYSICAL EXAMINATION:  VITALS:  Blood pressure 133/87, pulse 74 regular,  respirations 12 and unlabored.  GENERAL:  This is an obese young [place] American male in no acute  distress.  He is alert and oriented x3.  NECK:  No JVD.  No carotid bruits.  No thyromegaly.  No lymphadenopathy.  SKIN:  Warm and dry.  NEUROLOGIC:  No focal neurological deficits.  PSYCHIATRIC:  Mood and affect are  appropriate.  MUSCULOSKELETAL:  Muscle strength and tone are normal.  LUNGS:  Clear to auscultation bilaterally without wheezes, rhonchi, or  crackles noted.  CARDIOVASCULAR:  Regular rate and rhythm without  murmurs, gallops, or rubs noted.  There are no lifts or thrills noted.  ABDOMEN:  Soft, nontender, obese.  Bowel sounds are present.  EXTREMITIES:  No evidence of edema.  Pulses are 2+ in the bilateral  dorsalis pedis and posterior tibial arteries.  Pulses are 2+ in the  bilateral radial arteries.   DIAGNOSTIC STUDIES:  1. Surface echocardiogram obtained in our office today shows low      normal systolic function with an ejection fraction estimated at      50%.  There is also mild enlargement of the left ventricular and      right ventricular chambers.  There are no significant valvular      abnormalities noted.  2. Left heart catheterization performed on October 10, 2007, shows no      angiographic evidence of coronary artery disease.  Ejection      fraction by left ventricular angiogram was 40-45%.  Left      ventricular pressure was 129/17 with an end-diastolic pressure of      24.  Central aortic pressure was 132/63.   ASSESSMENT AND PLAN:  This is a pleasant 40 year old African American  male with a history of hypertension, obesity, and a family history of  premature coronary artery disease who presents today for followup of his  left heart catheterization.  As described above, the patient has no  angiographic evidence of coronary artery disease.  He does have a low  normal ejection fraction.  I do not think that his chest pain is related  to a cardiac etiology.  I have encouraged him to continue to follow with  his primary care physician, Dr. Patsy Lager, to explore other causes of his  chest pain.  I feel that it is most likely related to anxiety and panic  disorder.  I will not recommend any further cardiac workup at this time.  I would, however, like to see the patient back  in our office in 6  months, at which time we will perform a cardiac MRI to assess his left  ventricular and right ventricular chamber size as well as his overall  systolic function.  I will make no medication changes at the current  time.  I have encouraged the patient to continue to follow with Dr.  Patsy Lager in the future.    Verne Carrow,  MD  Electronically Signed   CM/MedQ  DD: 10/31/2007  DT: 11/01/2007  Job #: [id number]   cc:   Juleen China, MD

## 2010-05-19 NOTE — Cardiovascular Report (Signed)
NAMEJEANETTE, MOFFATT NO.:  [id number]   MEDICAL RECORD NO.:  [id number]          PATIENT TYPE:  OIB   LOCATION:  1961                         FACILITY:  [facility]   PHYSICIAN:  Verne Carrow, MDDATE OF BIRTH:  Nov 19, 1970   DATE OF PROCEDURE:  10/10/2007  DATE OF DISCHARGE:                            CARDIAC CATHETERIZATION   INDICATION:  Chest pain in an obese young African American male with a  history of hypertension and a family history of coronary artery disease.   OPERATOR:  Verne Carrow, MD   PROCEDURE PERFORMED:  1. Left heart catheterization.  2. Selective coronary angiography.  3. Left ventricular angiogram.   PROCEDURE IN DETAIL:  The patient was brought to the outpatient heart  catheterization laboratory after signing informed consent for the  procedure.  The right groin was prepped and draped in a sterile fashion.  A 4-French sheath was inserted into the right femoral artery.  Selective  coronary angiography was performed with standard diagnostic catheters.  A 4-French pigtail catheter was used across the aortic valve into the  left ventricle.  A left ventricular angiogram was then performed.  The  pigtail cath was pulled back across the aortic valve with no significant  pressure gradient measured.  The patient was taken to the recovery area  in stable condition.   FINDINGS:  1. No evidence of obstructive coronary artery disease.  The left main,      LAD, circumflex, and right coronary arteries are angiographically      normal.  2. Left ventricular angiogram showed global systolic dysfunction with      an ejection fraction of 40-45%.  3. Hemodynamic findings, left ventricular pressure 129/17, end-      diastolic pressure 24, and central aortic pressure 132/63.   IMPRESSION:  1. No angiographic evidence of coronary artery disease.  2. Mildly reduced global left ventricular systolic function.  3. Hypertension.   RECOMMENDATIONS:   I will increase the patient's Norvasc to 10 mg once  daily.  I would like to follow him up in the [facility] office here  in [place] in 2-3 weeks and we will get an echocardiogram the day of  his visit in our office there.     Verne Carrow, MD  Electronically Signed    CM/MEDQ  D:  10/10/2007  T:  10/10/2007  Job:  [id number]

## 2010-05-19 NOTE — Assessment & Plan Note (Signed)
[facility] OFFICE NOTE   Bradley Wells                     MRN:          [id number]  DATE:10/06/2007                            DOB:          03/31/1970    PRIMARY CARE PHYSICIAN:  Karleen Hampshire T. Copland, MD   REASON FOR VISIT:  Chest pain and abnormal nuclear perfusion stress  test.   HISTORY OF PRESENT ILLNESS:  Bradley Wells is a pleasant 40 year old  African American male with a past medical history significant for  hypertension, obesity, and a strong family history of coronary artery  disease who has had a prior workup for chest pain 4 years ago that  included a normal catheterization.  The patient began having recurrent  left-sided chest pain approximately 2 weeks ago.  He describes this pain  as a dull aching sensation over the upper left chest wall with radiation  into the left shoulder and left arm.  The pain has been there constantly  for 2 weeks.  He was seen in the emergency department at [facility] a little over a week ago and at that time was complaining of  fever, chills, cough, and left-sided chest pain.  A chest x-ray was done  in the emergency room and showed no acute disease.  His white blood cell  count was not elevated at that time.  He was referred for a myocardial  perfusion stress study which was performed on October 03, 2007.  His  stress test showed that he exercised for 8 minutes and stopped due to  shortness of breath, arm pain, and chest pain.  There were nonspecific  ST changes along with PVCs.  At the termination of the test, it was felt  that he had no ischemic EKG changes.  There was normal contractility and  thickening in all areas of the myocardium.  The overall left ventricular  function was normal with evidence of left ventricular enlargement.  This  was read as a low-risk stress nuclear study with probable soft tissue  attenuation in the anterior wall and  mild ischemia in the inferolateral  wall at the base.  The patient tells me today that he continues to have  left-sided chest pain.  We gave him one sublingual nitroglycerin here in  the office which did not significantly change his pain.  The pain cannot  be worsened by manipulating his shoulder, chest, or arm.  He has had no  associated diaphoresis, nausea, vomiting, palpitations, dizziness, near  syncope, or syncope.  He does describe a change in his baseline  breathing pattern lately which he feels is secondary to nasal  congestion.  He notes becoming more short of breath with minimal  exertion over the last several weeks.  His chest pain seems to worsen  slightly when he exercises; however, it does not resolve when he sits  down to rest.   PAST MEDICAL HISTORY:  1. Hypertension that was diagnosed 4-5 years ago.  The patient has      only been taking medications on a regular basis for this over the  last 8 months.  2. Obesity.  3. Prior cardiac workup for chest pain in 2005 included a left heart      catheterization performed in [facility] on November 12, 2003 by Dr. Jacinto Halim.  This demonstrated normal coronary arteries and      normal left ventricular systolic function.   PAST SURGICAL HISTORY:  1. Bilateral breast tumor removal.  2. Cholecystectomy.   ALLERGIES:  MORPHINE which causes hives and DILAUDID which makes him  itchy.   CURRENT MEDICATIONS:  Norvasc 5 mg once daily.   SOCIAL HISTORY:  The patient denies the use of alcohol, tobacco, or  illicit drugs.  He is married and has two healthy boys.  He works at a  desk job.  He is inactive and does not exercise on a regular basis.   FAMILY HISTORY:  The patient's mother died from complications of  diabetes and had a CABG prior to her death.  The patient's father died  in his 70s from a myocardial infarction.  His brother and sister both  have diabetes mellitus, but no diagnosed coronary artery disease  yet.   REVIEW OF SYSTEMS:  As stated in history of present illness and is  otherwise negative.   PHYSICAL EXAMINATION:  GENERAL:  He is a pleasant young [place] American  male who is overweight, but in no acute distress.  VITAL SIGNS:  Blood pressure 150/100, pulse 78 and regular, respirations  12 and unlabored.  Weight 294 pounds.  NECK:  No JVD.  No carotid bruits.  No thyromegaly.  No lymphadenopathy.  SKIN:  Warm and dry.  HEENT:  Oropharynx is clear.  Mucous membranes are moist.  LUNGS:  Clear to auscultation bilaterally with no wheezes, rhonchi, or  crackles noted.  CARDIOVASCULAR:  Regular rate and rhythm without murmurs, gallops, or  rubs noted.  ABDOMEN:  Soft, obese, and nontender.  Bowel sounds are present.  EXTREMITIES:  No evidence of edema.  Pulses are 2+ in all extremities.   DIAGNOSTICS STUDIES:  1. A 12-lead electrocardiogram obtained in our office today shows      normal sinus rhythm with a ventricular rate of 78 beats per minute      and T-wave inversions in the inferior leads and the anterolateral      leads.  There are changes through the precordial leads that are      consistent with early repolarization.  2. Myocardial perfusion stress study performed on October 03, 2007      shows that the patient exercised for 8 minutes and stopped due to      shortness of breath, arm pain, and chest pain.  There were no      significant ST-segment changes suggestive of ischemia during the      exercise.  There was normal contractility and thickening in all      areas of the myocardium.  The overall left ventricular function was      normal.  There was evidence of left ventricular enlargement.  There      was decreased uptake in the distal anterior wall as well as mild      ischemia in the basal inferolateral wall.  The final diagnosis on      this stress test was that this was probable soft tissue attenuation      in the anterior wall with mild ischemia in the  inferolateral wall      at the base.  ASSESSMENT/PLAN:  This is a pleasant 40 year old obese African American  male with a past medical history significant for hypertension as well as  a strong family history of coronary artery disease who presents today  for evaluation of his left-sided chest pain.  The patient had an  abnormal exercise myocardial perfusion study done 3 days ago at the  [facility].  As noted above, a left heart catheterization was  performed in November 2005 that showed normal coronary arteries and  normal LV function.   Today, I am presented with a young obese male, who has a strong family  history of coronary artery disease, is severely limited by constant  atypical left-sided chest pain, has who has an abnormal EKG and an  abnormal stress test.  I think the best plan is to proceed with  diagnostic left heart catheterization to rule out any obstructive  coronary artery disease.  I think it would be unlikely that he has  developed obstructive CAD in the last 4 years. We have arranged for this  to take place in the outpatient cath lab at [facility] on  Tuesday, October 10, 2007.  I will be performing the procedure.  We will  have the patient continue his Norvasc in the meantime.  I will see him  back in this office in several weeks following heart catheterization.     Verne Carrow, MD  Electronically Signed    CM/MedQ  DD: 10/06/2007  DT: 10/07/2007  Job #: [id number]   cc:   Juleen China, MD

## 2010-05-22 NOTE — Assessment & Plan Note (Signed)
[facility]                           GASTROENTEROLOGY OFFICE NOTE   MAXIMILLIANO, KERSH                     MRN:          [id number]  DATE:10/12/2005                            DOB:          1970-06-08    REFERRING PHYSICIAN:  Billie D. Bean, FNP   SURGEON:  Thomas A. Cornett, M.D.   REASON FOR REFERRAL:  Billie Bean asked me to evaluate Mr. Fanelli  regarding right lower quadrant pains.   HISTORY OF PRESENT ILLNESS:  Mr. Teaster is a very pleasant 40 year old man  who has had 2 weeks of right lower quadrant pain.  He said he first noticed  some diarrhea occurring over a weekend about 2 weeks ago, moving his bowels  4-5 times during that day.  This is a bit more than his usual, which is  approximately 3 times a day.  It was definitely looser stool, but it was non-  bloody.  He started having some pains in his right lower quadrant.  He has  no sick contacts and no recent travel.  He went to his primary care  physician, who sent him to see a surgeon.  Dr. Luisa Hart at [facility]  and Dr. Ezzard Standing at [facility] evaluated him last week with lab tests  and imaging studies.  Lab tests on October 07, 2005 showed a complete  metabolic profile with essentially a normal CBC with a normal white count  and a hemoglobin of 12.2.  Urinalysis was normal.  He had a CT scan with IV  and oral contrast 1 week ago.  This was normal, specifically the appendix  was seen and was normal.   Of interest, he had similar pains approximately 1 year ago, which were  evaluated while he was hospitalized at [facility] by Dr. Charlott Rakes, a  gastroenterologist through [facility].  Colonoscopy at that time  with a look in his terminal ileum was completely normal.  EGD at that time  showed mild nonspecific gastritis and was otherwise normal.  He had a small  bowel follow through at that time which showed normal small bowel.  He also  had right upper  quadrant discomfort, and was indeed found to have gallstones  in his gallbladder.  He underwent laparoscopic cholecystectomy by Dr. Marcille Blanco.  My review of his operative notes finds that this was essentially a  routine procedure.  An intraoperative cholangiogram was normal.   REVIEW OF SYSTEMS:  Notable for a 10 pound weight gain in the past year.  It  was otherwise essentially normal and is available on his nursing intake  sheet.   PAST MEDICAL HISTORY:  1. Laparoscopic cholecystectomy 1 year ago.  2. Sleep apnea.  3. Hypertension.   CURRENT MEDICATIONS:  None.   ALLERGIES:  1. MORPHINE.  2. DILAUDID.   SOCIAL HISTORY:  Nonsmoker, nondrinker.  Married with 2 children.  Works in  Photographer.   FAMILY HISTORY:  Mother and father with diabetes.  No colon cancer or colon  polyps in the family.  No IBD in the family.   PHYSICAL EXAMINATION:  VITAL SIGNS:  Height 6 feet, 3 inches, 284 pounds.  Blood pressure 170/110, pulse 80.  CONSTITUTIONAL:  Generally well-appearing.  NEUROLOGIC:  Alert and oriented x3.  Eyes - extraocular movements intact.  Mouth - oropharynx moist.  No lesions.  NECK:  Supple.  No lymphadenopathy.  CARDIOVASCULAR:  Heart regular rate and rhythm.  LUNGS:  Clear to auscultation bilaterally.  ABDOMEN:  Soft, mildly tender in the right lower quadrant.  Nondistended.  Normal bowel sounds.  EXTREMITIES:  No lower extremity edema.  SKIN:  No rashes or lesions on visible extremities.   ASSESSMENT AND PLAN:  A 40 year old man with right lower quadrant discomfort  and recent loose stools.   He has had essentially normal labs including a CBC and a CMET, a normal CT  scan with IV and oral contrast in the past week.  He had a GI evaluation 1  year ago for similar discomforts by Dr. Charlott Rakes, and was found to  have normal colon, normal terminal ileum, normal EGD.  Biopsies of small  intestine were normal, and he had mild nonspecific gastritis.  Had he not   had colonoscopy in the past year, I think I would proceed with that now, for  his symptoms are similar to that of the first presentation with inflammatory  bowel disease.  Given his normal work-up, though, this may simply be an  infectious etiology.  He had stool studies done, but those are not back.  I  think simply observing his clinical course over the next couple weeks is  reasonable.  If he gets worse, or if he is not better, then perhaps  repeating the colonoscopy would be reasonable.  Will therefore arrange for  him to have an office visit with me again in 2 weeks, and  I have given him another prescription for 40 Vicodin.  He does not at all  seem to be drug seeking, and I am comfortable giving him this with zero  refills.            ______________________________  Rachael Fee, MD   DPJ/MedQ DD:  10/12/2005 DT:  10/13/2005 Job #:  [id number]   cc:   Thomas A. Cornett, M.D.  Billie D. Bean, FNP

## 2010-05-22 NOTE — Op Note (Signed)
Bradley Wells, Bradley Wells              ACCOUNT NO.:  [id number]   MEDICAL RECORD NO.:  [id number]          PATIENT TYPE:  INP   LOCATION:  5730                         FACILITY:  [facility]   PHYSICIAN:  Shirley Friar, MDDATE OF BIRTH:  09/06/1970   DATE OF PROCEDURE:  09/30/2004  DATE OF DISCHARGE:                                 OPERATIVE REPORT   PROCEDURE:  Upper endoscopy.   INDICATIONS FOR PROCEDURE:  Epigastric pain, possible gastritis noted on  upper GI series.   ANESTHESIA:  Demerol 50 mg IV, Versed 5 mg IV.   FINDINGS:  The endoscope was inserted into the oropharynx which was normal  in appearance.  The esophagus was intubated.  The esophagus was normal in  its entirety with the gastroesophageal junction noted at 42 cm from the  incisors.  The endoscope was advanced down to the stomach where had some  scattered subepithelial hemorrhages consistent with mild gastritis and the  antral area was biopsied x 2.  The endoscope was advanced further into the  duodenum which had a normal duodenal bulb without ulcers or lesions noted.  The second portion of the duodenum had a white discoloration with normal  folds consistent with lacteals and the second portion was biopsied x 2.  The  endoscope was withdrawn back into the stomach and retroflexion revealed  normal angularis, cardia, and fundus.  The endoscope was withdrawn further  confirming the above findings.   ASSESSMENT:  Mild gastritis, otherwise normal.   PLAN:  1.  Follow up on the biopsies.  2.  Consider gastric emptying study as inpatient or outpatient.  3.  Trial of Dicyclomine.  4.  Proton pump inhibitor.  5.  Avoid NSAIDs.      Shirley Friar, MD  Electronically Signed     VCS/MEDQ  D:  09/30/2004  T:  09/30/2004  Job:  [id number] [id number]

## 2010-05-22 NOTE — H&P (Signed)
NAMETEVYN, CODD              ACCOUNT NO.:  [id number]   MEDICAL RECORD NO.:  [id number]          PATIENT TYPE:  INP   LOCATION:                               FACILITY:  [facility]   PHYSICIAN:  Fleet Contras, M.D.    DATE OF BIRTH:  May 18, 1970   DATE OF ADMISSION:  09/25/2004  DATE OF DISCHARGE:                                HISTORY & PHYSICAL   PRESENTING COMPLAINTS:  Right-sided abdominal pain.   HISTORY OF PRESENTING COMPLAINTS:  This is a readmission for this 40-year-  old African-American gentleman, the second in the last two weeks, for the  same complaint.  He presented to my office, this morning, for followup  following his previous discharge about 5 days earlier.  He was admitted at  [facility] about a week ago with severe right-sided, lower  quadrant, abdominal pain.  At that time he had had three CT scans of the  abdomen which only revealed sigmoid diverticulosis.  He had been extensively  worked up medically during that hospitalization at [facility]  including a CT scan of the abdomen as mentioned above, ANA, CRP,  sedimentation rate, stool for Hemoccult, stool for Clostridium difficile  toxin, urethral swab for GC and Chlamydia and all of these returned within  normal limits.  Renal function tests and CBCs were also within normal  limits.  His pain subsided on conservative therapy with oral Percocet,  Protonix orally as well as Lactose-free diet and he was discharged home on  09/21/2004.  On return to my office this morning he stated that the pain had  been on and off.  He continued to need the Percocet for the pain. He now  stated that he was having pain in his right upper quadrant associated with  nausea after meals. He did not have any vomiting.  He did not have any  diarrhea.  He did not have any blood in his stools. He denied any urinary  symptoms. He was on oral Levaquin for presumptive acute prostatitis that was  diagnosed by the [facility] last week for the cause of his pain.  During his last admission he was seen by gastroenterologist, Dr. Roosvelt Harps who thought that he may have some lactose intolerance and wanted  to rule out inflammatory bowel disease if the pain had continued or  worsened, but as I mentioned above, his pain subsided on conservative  therapy and he was, therefore, released to go home.  After evaluation in the  office, this morning, I scheduled him for an ultrasound scan of the right  upper quadrant to evaluate him for gallstones.  As soon as he left the  office his wife reported that he developed more severe pain now in the right  lower quadrant associated with vomiting; and I, therefore, advised him to  proceed to the emergency room at [facility].  There he had an  ultrasound scan performed which apparently showed small gallstones, but  there was no evidence of cholecystitis.  A surgical consult was requested by  the EDP.  The patient was  seen by Dr. Violeta Gelinas who thought that this  pain was not amenable to surgical treatment.  I was, therefore, invited to  admit him due to his need for pain medication and the patient could not be  discharged home.   PAST MEDICAL HISTORY:  1.  Undiagnosed abdominal pain for 3 weeks.  2.  Hypertension.  3.  Obstructive sleep apnea.  4.  History of atypical chest pain about a year ago for which he had a      cardiac catheterization showing normal coronary arteries and a low-      normal left ventricular ejection fraction 50-55%.   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   MEDICATIONS:  1.  He is on Toprol XL 50 mg once a day.  2.  He is on Xanax 0.25 mg 1 p.o. b.i.d. p.r.n.  3.  Percocet 5/325 one to two p.o. q.6 h. p.r.n.  4.  Protonix 40 mg once a day.  5.  Levaquin 500 mg daily which he has now completed.   ALLERGIES:  He is allergic to morphine which causes hives and Dilaudid which  causes difficulty in  breathing and sore throat.   FAMILY AND SOCIAL HISTORY:  He is married and lives with his family.  He  denies any use of alcohol, tobacco, or illicit drugs.  Both of his parents  are deceased and they apparently suffer from heart disease in their 11s.  He  has 2 siblings, both with diabetes, and one child in good health.   REVIEW OF SYSTEMS:  GENERAL:  He denies any fatigue, weakness, night sweats,  fevers, or chills.  SKIN:  He has no rash, lumps, or pruritus.  CNS:  He  denies any headaches weakness of extremities, numbness or paresthesias.  CVS:  He denies any chest pain, dyspnea, orthopnea, palpitations or PND.  RESPIRATORY:  He has no cough, sputum production, hemoptysis, or wheezing.  MUSCULOSKELETAL:  He denies any joint pains or swelling.  No joint  stiffness.  GU:  He has no frequency, hematuria, dysuria, or nocturia.  PSYCHOLOGICAL:  He has some anxiety but denies any depression, panic, or  suicidal ideations.   PHYSICAL EXAMINATION:  GENERAL:  He is a well-built, obese, young man not in  acute respiratory distress.  He does have some mild-to-moderate pain.  He is  not icteric.  He is not cyanotic.  He is not dehydrated.  HEENT:  Normocephalic, atraumatic.  His nasal mucosa is normal. He has no  maxillary or frontal sinus tenderness.  There are no oral or pharyngeal  lesions.  NECK:  Supple with no elevated JVD.  No cervical lymphadenopathy.  No  carotid bruits.  He has no thyromegaly.  CHEST:  Shows good air entry bilaterally with no rales, no rhonchi and no  wheezes.  ABDOMEN:  Obese, soft.  He is tender in the right lower quadrant as well as  the right upper quadrant.  There are no scars, no hernia.  No palpable  masses. There is no guarding or rebound tenderness.  There are no abdominal  bruits and no hepatosplenomegaly.  Bowel sounds are present.  EXTREMITIES:  Shows Neosporin ointment edema, no ulcers, no calf tenderness or swelling.  His peripheral pulses are present  and full bilaterally.  CENTRAL NERVOUS SYSTEM:  He is alert and oriented x3 with no focal  neurological deficits.   LABORATORY DATA:  So far available, his urinalysis is perfectly normal.  Lipase is 23.  CMP sodium 139, potassium 4.2, chloride 105, bicarbonate of  28, BUN 13, creatinine 1.8, glucose 99.  Bilirubin is 0.4.  AST is 29, ALT  52, alkaline phosphatase is 56.  His albumin is 4.3, calcium is 9.5.  CBC  shows a white count of 5.2, hemoglobin 13.5, hematocrit 39.6, platelet count  of 355 with normal differential.   ASSESSMENT:  Mr. Uselman is a 40 year old African-American gentleman with 3  weeks' of undiagnosed right-sided abdominal pain. Initially mainly in the  right lower quadrant, but now also involving the right upper quadrant.  He  has been subjected to three CAT scans, evaluated multiple times in the  emergency room, admitted to the hospital one time, and now readmitted this  time for exacerbation of the same pain.  He has had an ultrasound scan of  the abdomen which showed some gallstones without evidence of cholecystitis.  He has been evaluated by surgery and no intervention is recommended.  He is  being admitted to the hospital, again, for pain control and further  gastrointestinal workup.  A GI consult has been requested and the patient  will be seen by Dr. Herbert Moors for his insight.   ADMITTING DIAGNOSES:  1.  Right-sided abdominal pain.  2.  Cholelithiasis without cholecystitis.   PLAN OF CARE:  He will be admitted to a medical bed, placed on a clear  liquid diet, vital signs q.4 h. IV fluids with D5 and half normal saline at  5 mL an hour, IV Protonix 40 mg once a day, Percocet 5/325 one to two  tablets q.6 h. p.r.n., antiemetics p.r.n.  Further hospital plan will be  made based on recommendations from the gastroenterologist.      Fleet Contras, M.D.  Electronically Signed     EA/MEDQ  D:  09/25/2004  T:  09/26/2004  Job:  [id number]

## 2010-05-22 NOTE — Procedures (Signed)
NAME:  Bradley Wells, Bradley Wells              ACCOUNT NO.:  [id number]   MEDICAL RECORD NO.:  [id number]          PATIENT TYPE:  OUT   LOCATION:  SLEEP CENTER                 [facility]ACILITY[facility]  [facility]   PHYSICIAN:  Clinton D. Maple Hudson, M.D. DATE OF BIRTH:  May 31, 1970   DATE OF STUDY:  11/06/2003                              NOCTURNAL POLYSOMNOGRAM   INDICATION FOR STUDY:  Hypersomnia with sleep apnea.  Epworth sleepiness  score 16/24.  BMI 32.  Weight 265 pounds.   SLEEP ARCHITECTURE:  Total sleep time 397 minutes with sleep efficiency 93%.  Stage one was 8%.  Stage two 56%.  Stages three and four are 10% and REM was  25% of total sleep time.  Sleep latency was three minutes.  REM latency 62  minutes.  Awake after sleep onset 26 minutes.  REM RDI 20 per hour.   RESPIRATORY DATA:  Split study protocol.  RDI 69 per hour indicating severe  obstructive sleep apnea/hypopnea syndrome before CPAP.  This included 72  obstructive apneas, 118 hypopneas before CPAP.  Events were not positional.  REM RDI was 20 per hour.  CPAP was titrated to 22 CWP, RDI 0 per hour.  Good  control of respiratory events, arousals and snoring.  A ResMed ultra mirage  full face mask was used with a heated humidifier.   OXYGEN DATA:  Loud snoring with oxygen desaturation to a nadir of 58% before  CPAP.  After CPAP control, saturation held 94-96% on room air.   CARDIAC DATA:  Sinus rhythm with rare PVC.   MOVEMENTS/PARASOMNIA:  Occasional leg jerks with insignificant impact on  sleep.   IMPRESSION/RECOMMENDATION:  Severe obstructive sleep apnea/hypopnea  syndrome, RDI 69 per hour with desaturation to 58%.  CPAP titration to 22  CWP, RDI 0 per hour using a ResMed ultra mirage full face mask with heated  humidifier.                                                           Clinton D. Maple Hudson, M.D.  Diplomate, [employer]   CDY/MEDQ  D:  11/10/2003 11:20:14  T:  11/11/2003 09:47:59  Job:  [id number]

## 2010-05-22 NOTE — Assessment & Plan Note (Signed)
[facility]                           GASTROENTEROLOGY OFFICE NOTE   OAK, Bradley                     MRN:          [id number]  DATE:10/28/2005                            DOB:          01/11/1970    PROBLEM:  Abdominal pain.   HISTORY OF PRESENT ILLNESS:  Mr. Bala was seen as an emergency work-in  because of ongoing complaints of abdominal pain.  He was seen in emergency  room on October 22, 2005, because of persistent abdominal pain.  He  complains of severe right upper quadrant pain that radiates to the left  lower quadrant.  It is worsened postprandially.  He has complained of  nausea.  There has been no change in his bowel habits.  In the ER no  diagnosis was made.  He was discharged on Vicodin and Phenergan.  Mr.  Pecina has a history of a gastric ulcer a year ago.  A CT scan for similar  complaints, on March 13, 2005, was unremarkable.  Urinalysis from 1 week ago  was normal.   LAB WORK:  Is pertinent for a hemoglobin of 11.7 on October 22, 2005, with  an MCV of 81, white count was 7.3.  One year ago his hemoglobin was 12.2  with MCV of 84.6.  He is on no gastric irritants, including nonsteroidals.  He denies hematochezia or melena.   He is on no medications aside from Vicodin and Phenergan p.r.n.   PHYSICAL EXAMINATION:  He is a healthy but uncomfortable appearing male.  Pulse 60, blood pressure 136/92, weight 287.  HEENT:  EOMI. PERRLA. Sclerae are anicteric.  Conjunctivae are pink.  NECK:  Supple without thyromegaly, adenopathy or carotid bruits.  CHEST:  Clear to auscultation and percussion without adventitious sounds.  CARDIAC:  Regular rhythm; normal S1 S2.  There are no murmurs, gallops or  rubs.  ABDOMEN:  He has mild right periumbilical tenderness without guarding or  rebound.  There are no abdominal masses or organomegaly.  EXTREMITIES:  Full range of motion.  No cyanosis, clubbing or edema.  RECTAL:  There are  no masses.  Stool is Hemoccult negative.   IMPRESSION:  Persistent abdominal pain with elements of both upper and lower  abdominal pain.  I have some concern about recurrent gastric ulcer in view  of his history.  It is also noteworthy that he has a mild anemia that is  tending toward a microcytic anemia, raising the question of iron deficiency.  Small bowel disease, including Crohn's disease, is a consideration.  Colonoscopy in the last year mitigates against a colonic bleeding source.   RECOMMENDATIONS:  1. Begin Aciphex 20 mg daily (samples given).  2. Serial hemoccults.  3. Check iron, TIBC, ferritin, and repeat CBC.  4. EGD by Dr. Christella Hartigan.  5. To consider capsule endoscopy if EGD is not diagnostic.  6. Levbid 0.375 mg twice a day as needed.       Barbette Hair. Arlyce Dice, MD,FACG      RDK/MedQ  DD:  10/28/2005  DT:  10/28/2005  Job #:  [id number]   cc:   Reuel Boom  Marye Round, MD  Atha Starks. Bean, FNP  Clovis Pu. Cornett, M.D.

## 2010-05-22 NOTE — Op Note (Signed)
Bradley Wells, Bradley Wells              ACCOUNT NO.:  [id number]   MEDICAL RECORD NO.:  [id number]          PATIENT TYPE:  INP   LOCATION:  5730                         FACILITY:  [facility]   PHYSICIAN:  Shirley Friar, MDDATE OF BIRTH:  02-24-1970   DATE OF PROCEDURE:  09/28/2004  DATE OF DISCHARGE:                                 OPERATIVE REPORT   PROCEDURE:  Colonoscopy.   INDICATIONS:  Right lower quadrant pain and diarrhea.   PROCEDURE:  Demerol 75 mg and 10 mg of Versed was given.   FINDINGS:  Rectal exam was normal with no masses and normal rectal tone.  The adult adjustable colonoscope was inserted and advanced through a well-  prepped colon to the cecum, where the ileocecal valve and the appendiceal  orifice were identified.  The ileocecal valve was intubated and the terminal  ileum was normal in appearance without ulcers or lesions.  Careful  withdrawal of the colonoscope revealed normal colonic mucosa throughout the  colon and no ulcers or lesions noted.  Retroflexion was normal.   ASSESSMENT:  Normal colonoscopy and normal terminal ileum.   PLAN:  Recommend upper GI series and small bowel follow-through to evaluate  the upper GI tract and also to closely evaluate the proximal ileum to rule  out Crohn's disease.      Shirley Friar, MD  Electronically Signed     VCS/MEDQ  D:  09/28/2004  T:  09/29/2004  Job:  [id number]

## 2010-05-22 NOTE — Discharge Summary (Signed)
NAMEQUINTO, TIPPY NO.:  [id number]   MEDICAL RECORD NO.:  [id number]          PATIENT TYPE:  INP   LOCATION:  1515                         FACILITY:  [facility]   PHYSICIAN:  Fleet Contras, M.D.    DATE OF BIRTH:  1970/07/07   DATE OF ADMISSION:  09/19/2004  DATE OF DISCHARGE:  09/21/2004                                 DISCHARGE SUMMARY   PRESENTATION:  Bradley Wells is a 40 year old African-American gentleman with  a past medical history significant for hypertension and obstructive sleep  apnea.  He presented to the emergency room at [facility] after two  previous emergency room visits and evaluation in my office for three weeks  of right-sided lower quadrant abdominal pain.  The pain has been  progressively getting worse despite treatment with narcotic pain medication.  At his last emergency room, he was starting to develop prostatitis.  He was  referred to be seen by a urologist and was seen in Dr. Imelda Pillow office,  where he was started on oral Bactrim before later changed to oral Levaquin  by me due to persistent symptoms.  During his admission, he was still tender  in his prostate on rectal exam and was therefore started on IV ciprofloxacin  for presumptive acute prostatitis.  A CT scan performed previous times and  also on this occasion did show sigmoid diverticulosis with a normal appendix  and no other intra-abdominal abnormalities.  He was therefore admitted at  this time for further evaluation and management of his pain.   HOSPITAL COURSE:  In the emergency room, the patient received multiple doses  of IV Dilaudid due to persistent pain.  He developed an episode of itchy  throat and some difficulty breathing.  He was felt to have developed an  allergy to DILAUDID, although he only had a history of allergy to MORPHINE.  He was observed overnight in the intensive care unit without any further  problems.  He was started on oral Percocet 1-2  tablets of 5/325 1 q.6h.,  which helped the pain.  He was initially on a clear-liquid diet, and this  was later changed to a regular diet, lactose free.  Further workup included  sed rate, ANA, CRP, RPR, urine drug screen, urethral swab for GC and  Chlamydia, and all of these returned negative.   A gastroenterology consult was requested, and the patient was seen by Dr.  Roosvelt Harps, who thought this was more functional than pathologic, and  stool for Clostridium difficile and Hemoccult studies were still pending at  the time of discharge.  Today, the patient complained of very mild abdominal  pain, completely relieved by Percocet.  He has had no nausea, vomiting, or  diarrhea.  He has had no blood in his stools.   PHYSICAL EXAMINATION:  VITAL SIGNS:  His vital signs are completely normal.  GENERAL:  He is not icteric.  He is not cyanotic.  He is not dehydrated.  CHEST:  Clear to auscultation.  ABDOMEN:  Soft.  Obese.  There is vague tenderness in his right lower  quadrant without guarding  or rebound.  Bowel sounds are present.   His latest laboratory data shows a white count of 5.3, hemoglobin 12.6,  hematocrit 37.9, platelet count 346.  Sodium 139, potassium 4.1, chloride  105, bicarbonate 28, BUN 10, creatinine 1.7, glucose 94.  As stated above,  his urethral swab for GC and Chlamydia were negative.  His sed rate was 12,  which is within normal limits.  His RPR was nonreactive.  Urine drug screen  was negative.   He had an x-ray of his right hip and the lumbosacral spine, and these were  completely normal.   Urinalysis was potentially normal.   ASSESSMENT:  Bradley Wells is a 40 year old African-American gentleman  admitted with severe right lower quadrant abdominal pain which has subsided  on oral narcotic pain medication as well as intravenous ciprofloxacin for  presumptive acute prostatitis and considered stable for discharge home.   DISCHARGE DIAGNOSES:  1.  Functional  right lower quadrant abdominal pain.  2.  Presumptive acute prostatitis.  3.  History of hypertension.  4.  History of obstructive sleep apnea.   DISCHARGE MEDICATIONS:  1.  Xanax 0.25 mg 1 p.o. b.i.d. p.r.n.  2.  Toprol XL 25 mg 1 p.o. daily.  3.  Levaquin 500 mg 1 p.o. daily x7 days.  4.  Percocet 5/325 mg 1-2 p.o. q.6h. p.r.n.  5.  Enteric-coated aspirin 81 mg daily.   He will follow up with me at the end of this week on September 25, 2004.  I  have advised him not to return to work until he has followed up in the  office.   This plan of care has been discussed with him, and all of his questions  answered.      Fleet Contras, M.D.  Electronically Signed     EA/MEDQ  D:  09/21/2004  T:  09/21/2004  Job:  [id number]

## 2010-05-22 NOTE — Consult Note (Signed)
NAME:  Bradley Wells, Bradley Wells NO.:  [id number]   MEDICAL RECORD NO.:  [id number]          PATIENT TYPE:  INP   LOCATION:  5730                         FACILITY:  [facility]   PHYSICIAN:  Graylin Shiver, M.D.   DATE OF BIRTH:  Nov 08, 1970   DATE OF CONSULTATION:  09/26/2004  DATE OF DISCHARGE:                                   CONSULTATION   REASON FOR CONSULTATION:  The patient is a 40 year old male who was admitted  to the hospital last evening by Dr. Allyson Sabal for persistent complaints of right  lower quadrant abdominal pain. The patient has been experiencing right lower  quadrant abdominal pain for the past month. He states it is a constant pain  rather a dull pain but will build up in intensity. He used to have normal  formed bowel movement but over the past month, he has noticed they have been  a little looser. He has not seen any blood in the stool. He was previously  seen by Dr. Roosvelt Harps who evaluated him. No obvious cause for the  right lower quadrant pain was found. He has had several CT scans of the  abdomen and pelvis which were unrevealing for the source of his right lower  quadrant abdominal pain. He states that this pain does not seem to get  better after a bowel movement.   Over the past week, he has experienced some discomfort in the right upper  quadrant primarily after eating, associated with some nausea. An ultrasound  was done showing some gallstones. He was seen in consultation by surgery  last evening who did not feel that he had a surgical problem at this time  and that the gallstones were not the source of the right lower quadrant  abdominal pain.   PAST MEDICAL HISTORY:  Sleep apnea, obesity, hypertension.   ALLERGIES:  MORPHINE, DILAUDID.   SOCIAL HISTORY:  Does not smoke or drink alcohol.   REVIEW OF SYSTEMS:  No chest pain, shortness of breath, cough or sputum  production.   PHYSICAL:  He does not appear in any acute distress. He is  nonicteric. Neck  supple. Heart regular rhythm. No murmurs. Lungs were clear. Abdomen is soft.  He does have some mild discomfort to palpation in the right lower quadrant  and also in the right upper quadrant. There is no rebound or guarding.   Labs are unrevealing.   IMPRESSION:  1.  Chronic right lower quadrant pain over the past month associated with      some change in bowel pattern in that his stools are looser than they      used to be.  2.  Gallstones:  This issue is being followed up by surgery at this time.   PLAN:  I think at this point in view of his multiple negative recent CT scan  that the next step would be colonoscopy to look at the right colon to look  for any evidence of Crohn's disease. If this is unrevealing, I would then do  a upper GI with small-bowel follow-through looking at the area of the  terminal ileum specifically.           ______________________________  Graylin Shiver, M.D.     SFG/MEDQ  D:  09/26/2004  T:  09/26/2004  Job:  [id number]

## 2010-05-22 NOTE — Cardiovascular Report (Signed)
NAMEMORAD, TAL              ACCOUNT NO.:  [id number]   MEDICAL RECORD NO.:  [id number]          PATIENT TYPE:  OIB   LOCATION:  2899                         FACILITY:  [facility]   PHYSICIAN:  Cristy Hilts. Jacinto Halim, MD       DATE OF BIRTH:  October 22, 1970   DATE OF PROCEDURE:  11/12/2003  DATE OF DISCHARGE:                              CARDIAC CATHETERIZATION   PROCEDURE PERFORMED:  1.  Left ventriculography.  2.  Selective right and left coronary arteriography.  3.  Nonselective renal angiography.  4.  Right femoral angiography and closure of the right femoral arterial      access Angio-Seal.   INDICATION:  Mr. Huckeby is a pleasant 40 year old gentleman with history  of hypertension, obesity who was seen by me in the office for exertional  chest discomfort and shortness of breath.  He had a very strong family  history of premature coronary artery disease.  Given his multiple cardiac  risks that included dyslipidemia, family history, ongoing chest pain with  associated shortness of breath, he underwent a Cardiolite stress test which  revealed anterior wall ischemia and seen only the axis views. However,  before branding him as having coronary disease, given the medical therapy  options versus cardiac catheterization, the patient decided to proceed with  cardiac catheterization for definitive diagnosis of coronary disease.  Hence, he was brought to the cardiac catheterization lab to evaluate his  coronary anatomy.   HEMODYNAMIC DATA:  1.  The left ventricular pressures were 130/12 with end-diastolic pressure      of 16 mmHg.  2.  Aortic pressure 123/84 with a mean of 103 mmHg.  3.  There was no pressure gradient across the aortic valve.   ANGIOGRAPHIC DATA:  Left ventricle:  Left ventricular systolic function was  in the lower limit of normal.  The ejection fraction estimated around 50-  55%.  There was no regional wall motion abnormality.  There was no  significant mitral  regurgitation.   Right coronary artery:  The right coronary artery is a large caliber vessel.  It is a dominant vessel.  It is tortuous in its mid segment making a 180-  degree loop. It is otherwise normal.   Left main coronary artery:  Left main coronary artery is a large caliber  vessel. It is normal.   Left anterior descending artery:  The left anterior descending artery is  large caliber vessel.  It gives origin to high diagonal one which is  moderate size and a large diagonal two.  The LAD wraps around the apex.  It  is normal.   Circumflex coronary artery:  Circumflex is a large caliber vessel.  It  continues a large obtuse marginal one.  It is normal.   NONSELECTIVE RENAL ARTERIOGRAPHY:  Nonselective renal arteriography revealed  widely patent renal arteries, one on either side.  There is no evidence of  renal artery stenosis.   IMPRESSION:  1.  Low normal left ventricular systolic function.  Ejection fraction 50-      55%.  2.  Normal coronary arteries.  3.  No  evidence of renal artery stenosis.   RECOMMENDATIONS:  1.  Evaluation for noncardiac chest pain is indicated.  2.  The patient does have significant cardiac risk factors, that include      hypertension, obesity, and a strong family history of premature coronary      artery disease.  Given this, continued primary prevention with      aggressive lipid management and hypertension management is indicated.      The patient will follow up with Dr. Fleet Contras for further management      and evaluation.   TECHNIQUE OF PROCEDURE:  Under usual sterile precautions, using a 6 French  right femoral arterial access, a 6 [place] multipurpose pigtail catheter was  advanced into the ascending aorta over a 0.035-inch J wire.  The catheter  was then gently advanced to the left ventricle and left ventricular  pressures were monitored.  Hand contrast injection of the left ventricle was  performed both in the LAO and RAO  projection.  The catheter was flushed with  saline and pulled back into the ascending aorta and pressure gradient across  the aortic valve was monitored.  The right coronary artery was selectively  engaged and angiography was performed.  Then, the left main coronary artery  was subselectively engaged.  Angiography was repeated.  Then, the catheter  was pulled back in the abdominal aortogram and abdominal aortogram was  performed.  Then, the catheter was pulled out of the body in the usual  fashion.  A 5 French Judkins left 4 diagnostic catheter was utilized to  engage the left main coronary artery and angiography was repeated.  Then,  the catheter was pulled out of the body in the usual fashion.  Right femoral  angiography was performed through the arterial access sheath and the access  was closed with Angio-Seal with excellent hemostasis.  The patient was  transferred to recovery unit in stable condition.  No immediate  complications noted.      Kirk Ruths  D:  11/12/2003  T:  11/12/2003  Job:  [id number]

## 2010-05-22 NOTE — Discharge Summary (Signed)
NAMEJIMMEY, HENGEL NO.:  [id number]   MEDICAL RECORD NO.:  [id number]          PATIENT TYPE:  INP   LOCATION:  5730                         FACILITY:  [facility]   PHYSICIAN:  Fleet Contras, M.D.    DATE OF BIRTH:  Aug 27, 1970   DATE OF ADMISSION:  09/25/2004  DATE OF DISCHARGE:  10/01/2004                                 DISCHARGE SUMMARY   HISTORY OF PRESENT ILLNESS:  Mr. Cornforth is a 40 year old African-American  gentleman admitted for the second time in about two weeks with right-sided  abdominal pain.  He has been treated for this pain twice in the emergency  room, twice in my office and had CT scan that did not reveal any acute  appendicitis or any acute problem except for sigmoid diverticulosis without  diverticulitis.  He had blood work extensive, stool workup performed which  was essentially negative.  His previous admission at [facility].  The patient improved on conservative therapy and was going to be discharged  home on oral Percocet, Protonix, a lactose-free diet but his symptoms  persisted at home and on the day of this admission he was in severe pain,  frustrated, nauseated and was vomiting.  He has allergies to morphine and  Dilaudid and pain was not well-controlled in the emergency room with Nubain  injection and he was therefore admitted for further evaluation and  appropriate therapy.   PAST MEDICAL HISTORY:   HOSPITAL COURSE:  On admission the patient was started on intravenous  fluids, IV Protonix as well as clear liquid diet.  A gastroenterology  consult was requested.  The patient was currently seen by  Dr. Graylin Shiver who recommended the patient should undergo colonoscopy to rule out  Crohn's disease.  The patient was also seen by Dr. Violeta Gelinas of surgery  who did not find any acute surgical issues.  An ultrasound scan of the  abdomen did show gallstones without evidence of cholecystitis therefore no  surgical  intervention was recommended.  Colonoscopy was performed September 28, 2004 and this was reported to be normal up to the terminal ileum.  The  double contrast upper GI series with small-bowel follow through was  performed and this showed strong suspicion for gastric antral ulcer and  gastritis and duodenitis with the jejunal and terminal ileum were normal.  An upper GI endoscopy was therefore performed by Dr. Charlott Rakes on  September 30, 2004 and this showed mild gastritis which was otherwise normal  and he recommended followup on the gastric biopsies, to consider outpatient  or inpatient gastric emptying study, a trial of dicyclomine and proton pump  inhibitor and to avoid any NSAIds.  The patient was placed on a two gram  sodium low fat lactose-free diet which he tolerated but he continued to have  some nausea, abdominal cramps and he actually had one episode of epigastric  pain which he described as radiating to his chest.  An EKG did not show any  acute ST-T wave changes and one set of cardiac enzymes were negative.  Mr.  Vasek also has  hypertension and his blood pressure has been fairly  controlled on Toprol and hydrochlorothiazide.  This morning he continues to  have complaints of abdominal pain and nausea.  He denies any chest pain.  He  has had no fevers or chills, no further vomiting, no diarrhea.  He denies  any shortness of breath.  He latest vital signs shows a blood pressure of  140/78, heart rate 65, temperature 97.6.  His neck is supple, a bit of JVD.  Chest shows good air entry bilaterally. HS 1&2 normal.  Abdomen was soft,  obese.  During palpation of the abdomen it is soft epigastric, there is no  guarding, no rebound tenderness BS present and normal.  Neurologic:  Alert  and oriented x3 with no focal deficit.   ASSESSMENT:  40 year old AAM with Past medical history significant for  hypertension, obstructive sleep apnea, chronic abdominal pain for over one   month which has not been fully diagnosed and not explained by current  findings.  He has been found to have gastritis and gallstones but this did  not clearly explain the severity of his symptoms.  There is a question of  psychosomatic and I have suggested a mental health evaluation which he  declined.  I have informed him that there is no acute intervention planned  or warranted at this point.  He will be discharged home.  He is to follow up  with me in two weeks so we can proceed in 1-2 weeks for a gastric emptying  study.   DISCHARGE DIAGNOSES:  1.  Abdominal pain.  2.  Gastritis.  3.  Gallstones without cholecystitis.  4.  History of hypertension.   DISCHARGE MEDICATIONS:  1.  Xanax 0.25 mg b.i.d.  2.  Toprol XL 50 mg daily.  3.  HCTZ 12.5 mg daily.  4.  Protonix 40 mg one p.o. daily.  5.  Dicyclomine 20 mg one p.o. t.i.d. p.r.n.   DISPOSITION:  I have advised him not to return to work until next week.  He  will be on a bland, low salt and low cholesterol diet.      Fleet Contras, M.D.  Electronically Signed     EA/MEDQ  D:  10/01/2004  T:  10/01/2004  Job:  [id number]

## 2010-05-22 NOTE — Assessment & Plan Note (Signed)
[facility]                         GASTROENTEROLOGY OFFICE NOTE   Bradley Wells, Bradley Wells                     MRN:          [id number]  DATE:12/06/2005                            DOB:          Dec 29, 1970    PRIMARY CARE PHYSICIAN:  Dr. Everrett Coombe.   GI PROBLEM LIST:  1. Constellation of upper and lower GI symptoms.  This is right upper      quadrant to left lower quadrant abdominal pains, postprandial in      nature.  New loose stools.  CT scan March 2007 with IV and oral      contrast was essentially normal.  EGD colonoscopy by Dr. Doy Mince one year ago (2006) was normal.  Normal CVC, normal      complete metabolic profile, normal urinalysis.  Repeat CT scan      October 2007 with IV and oral contrast was normal.  Status post      cholecystectomy 2006 with normal IOC.  Repeat EGD October 2007      normal.  Swallow follow-through October 13, 2005 normal.  Stool      studies for ovo parasites, routine stool culture, fecal fat, fecal      leukocytes, Giardia, Cryptosporidia, and C. Dif were all normal.   INTERVAL HISTORY:  I last saw Bradley Wells 3 weeks ago.  Since then, his stool  testing came back negative.  He did complete a empiric course of Flagyl  and said that his diarrhea did seem to improve somewhat with this.  Most  noticeable however was when he considered that his C-PAP machine may be  causing some of his discomforts.  He brought this up to his  pulmonologist and they thought that this was certainly reasonable, so he  came off his C-PAP machine for a brief time and noticed a definite  improvement in his gassiness and his pains.  He went back on his C-PAP  machine and the gassiness and pains returned.  He is now awaiting a new  BiPAP machine.   CURRENT MEDICATIONS:  Prevacid, C-PAP machine, metoprolol, Gas-X p.r.n..   PHYSICAL EXAMINATION:  Weight 285 pounds, blood pressure 134/92, pulse  72.  CONSTITUTIONAL:  Generally well  appearing.  ABDOMEN:  Soft, nontender, nondistended, normal bowel sounds.  EXTREMITIES:  No extremity edema.   ASSESSMENT AND PLAN:  A 40 year old man with abdominal discomforts,  gassiness, intermittent loose stools.   It is very interesting that his pains and gassiness all resolved when he  was off his C-PAP machine.  I have not personally seen this before but  it makes physiologic sense.  He is probably swallowing a lot of air.  Thinking back, he does recall that his symptoms started to gradually  begin around the time that he began the C-PAP machine in 2006.   He will be fitted for a BiPAP machine, probably starting next week.  He  will return to see me in 6 weeks time to update me on his symptoms.  I  see no reason for any further testing at this point.  Rachael Fee, MD  Electronically Signed    DPJ/MedQ  DD: 12/06/2005  DT: 12/06/2005  Job #: [id number]   cc:   Joni Fears D. Young, MD, FCCP, FACP  Billie D. Bean, FNP

## 2010-05-22 NOTE — Consult Note (Signed)
NAMEPENIEL, HASS              ACCOUNT NO.:  [id number]   MEDICAL RECORD NO.:  [id number]          PATIENT TYPE:  INP   LOCATION:  0165                         FACILITY:  [facility]   PHYSICIAN:  Althea Grimmer. Santogade, M.D.DATE OF BIRTH:  Jun 26, 1970   DATE OF CONSULTATION:  09/19/2004  DATE OF DISCHARGE:                                   CONSULTATION   Mr. Nohr is a 40 year old male, whom I'm asked to see for persisting  right lower quadrant abdominal discomfort.  He apparently has had 3  emergency visits in the last 3 weeks as well as having seen a urologist as  an outpatient.  Last night in the emergency room, apparently the patient was  doubled over in pain and received Dilaudid and then began to have  respiratory problems.  It is noted that he is supposedly allergic to  MORPHINE.  He describes the pain as mostly located in the right lower  quadrant or suprapubic area of the abdomen.  It has been persistent with  intermittent exacerbations.  It may radiate down into his groin and  occasionally up into his flank.  In addition, he feels it sometimes on the  left side.  He vomited last night, but this has not been a typical complaint  of his.  He says his stool is somewhat loose, but he is only going once a  day, and he does not have any melena or hematochezia.  He denies dysphagia,  reflux, or upper abdominal discomfort.  It is noted that he has had a  cardiac cath for atypical chest pain with normal coronaries found.   PAST MEDICAL HISTORY:  1.  Sleep apnea.  2.  Obesity.  3.  Hypertension.   CURRENT MEDICATIONS JUST PRIOR TO ADMISSION:  He was started on Levaquin and  sulfamethoxazole after having seen urology.   ALLERGIES:  1.  MORPHINE.  2.  DILAUDID.   FAMILY HISTORY:  Negative for known inflammatory bowel disease or colorectal  neoplasia.   SOCIAL HISTORY:  Nonsmoker, nondrinker, no drug use.   REVIEW OF SYSTEMS:  GENERAL:  No weight loss or night sweats.   ENDOCRINE:  No history of diabetes or thyroid problems.  SKIN:  No rash or pruritus.  EYES:  No icterus or change in vision.  ENT:  No aphthous ulcers or chronic  sore throat.  RESPIRATORY:  No shortness of breath, cough, or wheezing,  history of sleep apnea.  CARDIAC:  As above.  GI:  As above.  GU:  As above.   PHYSICAL EXAMINATION:  GENERAL:  He is a robust adult male, possibly mildly  sedated in no acute distress.  VITAL SIGNS:  Afebrile, blood pressure 140/90, pulse 70 and regular.  SKIN:  Normal.  EYES:  Anicteric.  Conjunctivae pink.  OROPHARYNX:  Unremarkable.  NECK:  Supple without thyromegaly or adenopathy.  CHEST SOUNDS:  Clear.  HEART SOUNDS:  Regular rate and rhythm.  ABDOMEN:  Obese and soft with minimal right suprapubic tenderness to deep  palpation.  There is no mass, rebound, organomegaly, or obvious hernia.  RECTAL EXAM:  Not  performed.  EXTREMITIES:  Without cyanosis, clubbing, edema, or rash.   LABORATORY TESTS:  Hemoglobin 12.8, white blood count 7.1, platelet count  normal.  Electrolytes near normal.  Sodium 132, BUN 20, creatinine 1.6.  Liver function tests normal.  Urinalysis negative.  Chest x-ray negative.  CT scan demonstrates sigmoid diverticula but no other acute findings.  It is  noted he has had 3 CT scans in the last few weeks.   IMPRESSION:  Right lower quadrant or suprapubic discomfort of unclear  etiology.  The patient has no chronic gastrointestinal history.  Differential diagnosis would include urologic discomfort versus very  unlikely Crohn's disease not seen on the CT scan versus functional abdominal  pain.   PLAN:  Check stool guaiacs.  Check stool culture.  Consider repeat imaging  of the abdomen if pain persists.  Colonoscopy will be discussed with my  colleague covering this hospital next week, though it is very doubtful it  would be revealing.      Althea Grimmer. Luther Parody, M.D.  Electronically Signed     PJS/MEDQ  D:  09/19/2004  T:   09/19/2004  Job:  [id number]   cc:   Fleet Contras, M.D.  [address]  [place] [address]  Fax: [phone]  Email: [email or web address]   Sigmund I. Patsi Sears, M.D.  [address]  [place]  [place] [address]  Fax: [phone]

## 2010-05-22 NOTE — Assessment & Plan Note (Signed)
[facility]                               PULMONARY OFFICE NOTE   Bradley Wells, Bradley Wells                     MRN:          [id number]  DATE:11/23/2005                            DOB:          1970/05/17    PULMONARY/SLEEP FOLLOWUP:   PROBLEMS:  Obstructive sleep apnea.   HISTORY:  Since last year, he has been evaluated by Dr. Christella Hartigan for abdominal  pain with question of recurrent gastric ulcer or colitis.  He has  transferred his primary care to the [facility] office at [facility].  I had last seen him over a year ago.  He comes now complaining that  he wakes very gaseous every morning and with abdominal cramping pain.  He  questions how much of this may be due to the CPAP, which is set at 20 CWP.  Wearing this, he has much less daytime sleepiness, but still gets a little  breakthrough snoring.  The mask is comfortable.   MEDICATIONS:  1. Prevacid.  2. Flagyl 250 mg t.i.d.  3. CPAP 20 CWP.   OBJECTIVE:  VITAL SIGNS:  Weight has gone up since last year from 260 to 285  pounds.  BP 142/96, pulse regular at 77.  Room air saturation 98%.  GENERAL:  He is a big man, alert.  There are no pressure marks from CPAP on  his face.  Breathing is unlabored.  HEENT:  There is some mucus in both nares without obstruction.  Palate  length is 4/4, obscuring the posterior pharyngeal wall, but I do not see  erythema.  Voice quality is normal.  There is no stridor or thyromegaly.  LUNGS:  Clear.  HEART:  Heart sounds are regular without murmur.   IMPRESSION:  Obstructive sleep apnea:  Possibly significant aerophagia on  CPAP, causing cramping.  Since he is still noticing snoring at this  pressure, we will have to accept some compromise if we are going to reduce  the pressure to reduce air swallowing.   PLAN:  His home care company has left [place] and is now in [place].  He may need to switch but for now, he is going to ask [facility] to  change his CPAP at 20 CWP to  BiPAP at inspiratory pressure of 20 and expiratory pressure of 17.  I have  also suggested simethicone.  If he does well, he will return in a year but  earlier p.r.n.     Bradley D. Maple Hudson, MD, Tonny Bollman, FACP  Electronically Signed    CDY/MedQ  DD: 11/24/2005  DT: 11/25/2005  Job #: [id number]   cc:   Corinda Gubler at [facility]  Rachael Fee, MD  Cristy Hilts. Jacinto Halim, MD

## 2010-05-22 NOTE — Op Note (Signed)
Bradley Wells, Bradley Wells              ACCOUNT NO.:  [id number]   MEDICAL RECORD NO.:  [id number]          PATIENT TYPE:  AMB   LOCATION:  SDS                          FACILITY:  [facility]   PHYSICIAN:  Wilmon Arms. Corliss Skains, M.D. DATE OF BIRTH:  08/07/1970   DATE OF PROCEDURE:  10/21/2004  DATE OF DISCHARGE:                                 OPERATIVE REPORT   PREOPERATIVE DIAGNOSIS:  Symptomatic cholelithiasis.   POSTOPERATIVE DIAGNOSIS:  Symptomatic cholelithiasis.   PROCEDURE:  Laparoscopic cholecystectomy with intraoperative cholangiogram.   SURGEON:  Wilmon Arms. Corliss Skains, M.D.   ASSISTANT:  Ollen Gross. Carolynne Edouard, M.D.   ANESTHESIA:  General endotracheal.   INDICATIONS FOR PROCEDURE:  The patient is a 40 year old male who has had  abdominal pain on his right side for the last month.  He has undergone a  fairly extensive workup which included an upper GI series, EGD, and  colonoscopy.  These showed mild gastritis, but no evidence of ulcers or  other etiology for his pain.  Colonoscopy was normal to the TI.  Ultrasound  showed gallstones, but no evidence of cholecystitis.  The patient was  referred for further surgical evaluation.  Due to his symptoms, as well as  ultrasound findings, I recommended a laparoscopic cholecystectomy.  The  patient consented to the procedure.   DESCRIPTION OF PROCEDURE:  The patient was brought to the operating room and  placed in the supine position on the operating table.  After an adequate  level of general endotracheal anesthesia was obtained, the patient's abdomen  was prepped with Betadine and draped as a sterile fashion.  The area around  his umbilicus was infiltrated with 0.25% Marcaine.  A transverse incision  was made just above the umbilicus.  Dissection was carried down through  subcutaneous tissue to the fascia.  The fascia was grasped with Kocher  clamps and divided in a vertical fashion.  The peritoneum was entered.  A  stay suture of 0 Vicryl was placed in  a pursestring fashion.  The Hasson  cannula was then inserted and secured with a stay suture.  Pneumoperitoneum  was obtained by insufflating CO2 maintaining maximum pressure of 15 mmHg.  The patient was then positioned in reverse Trendelenburg position and  rotated slightly to his left.  A 5 mm port was placed in the subxiphoid  position.  Two 5 mm ports were placed in the right upper quadrant.  The  gallbladder was quite large and distended, but we were able to grasp the  fundus with a million dollar grasper and elevate it over the edge of the  liver.  The patient had a large amount of flimsy adhesions to the surface of  the gallbladder.  These were taken down with a combination of cautery and  blunt dissection.  The infundibulum of the gallbladder was grasped with a  clamp and retracted laterally.  Blunt dissection was used to identify the  cystic duct.  This was circumferentially dissected.  A clip was placed  distally.  A small opening was created on the anterior surface of the cystic  duct.  Bile and  some tiny stones were encountered.  A Cook cholangiogram  catheter was inserted through a separate stab incision.  This was inserted  into the cystic duct and secured with a clip.  Saline flowed easily into the  cystic duct with no extravasation.  Cholangiogram was then obtained with a  total of 17 mL of contrast which showed good filling proximally and distally  in the bowel duct with no filling defects.  Contrast flowed easily into the  duodenum.  The clip was removed and the cholangiogram catheter was removed  as well.  The cystic duct was ligated with two clips proximally and divided.  The cystic artery was also identified, ligated with clips, and divided.  Cautery was then used to remove the gallbladder from the liver bed.  A tiny  hole was made in the gallbladder and some bile was spilled, but no stones  were noted.  The gallbladder was placed in an EndoCatch bag and removed from   the umbilical port.  The ports were all reinserted and the liver bed was  thoroughly irrigated with saline.  No bleeding was noted.  The clips were  inspected and were noted to be intact.  The ports were then removed under  direct vision and pneumoperitoneum was released.  The fascia at the  umbilical incision was closed with a 0 Vicryl.  Skin incisions were all  closed with 4-0 Monocryl in subcuticular fashion.  Steri-Strips and clean  dressings were applied.  The patient was extubated and brought to the  recovery room in stable condition.      Wilmon Arms. Tsuei, M.D.  Electronically Signed     MKT/MEDQ  D:  10/21/2004  T:  10/21/2004  Job:  [id number]

## 2010-05-22 NOTE — Assessment & Plan Note (Signed)
[facility]                                   ON-CALL NOTE   DAVIEN, MALONE                       MRN:          [id number]  DATE:10/04/2005                            DOB:          [birth year]    TELEPHONE CONVERSATION:  His phone number is [phone] but the phone call  actually came from radiology at [phone].  He is a patient at [facility]  who had been evaluated I think by Willaim Sheng D. Bean, FNP today with concerns  about appendicitis.  A CT of the abdomen and pelvis was benign.  I spoke to  the patient at the radiology suite.  He said that he was having upset  stomach and pain and it sounded like it was subacute at this point and since  his CAT scan was negative, I instructed him to go home, not to try to eat  anything but to just drink fluids until he felt a little better and to call  in the morning if he was not feeling any better and we can re-evaluate him  but there was no emergency situation.            ______________________________  Karie Schwalbe, MD      RIL/MedQ  DD:  10/04/2005  DT:  10/05/2005  Job #:  [id number]

## 2010-05-22 NOTE — Consult Note (Signed)
NAMEJAVEL, HERSH              ACCOUNT NO.:  [id number]   MEDICAL RECORD NO.:  [id number]          PATIENT TYPE:  INP   LOCATION:  5730                         FACILITY:  [facility]   PHYSICIAN:  Gabrielle Dare. Janee Morn, M.D.DATE OF BIRTH:  October 20, 1970   DATE OF CONSULTATION:  09/25/2004  DATE OF DISCHARGE:                                   CONSULTATION   REASON FOR CONSULTATION:  Right sided abdominal pain x1 month.   HISTORY OF PRESENT ILLNESS:  I was asked by Dr. Paula Libra in the emergency  department at [facility] to evaluate this patient regarding right  sided abdominal pain. He has had it for over a month. He has had many tests  for working this pain up including CT scans of the abdomen and pelvis two or  three times in the recent past. Documented in the [facility] system, are CT scans  of the abdomen and pelvis on September 11, 2004 and September 19, 2004, both  of which were negative for acute process. He was admitted to [facility] over this past weekend by Dr. Concepcion Elk for the same  complaints. He claims they did not resolve the cause. He was evaluated  during that admission by Dr. Luther Parody from gastroenterology and he was  later discharged home on Monday. Today, the patient had more of the same  pain and some emesis x1. He came to the [facility] emergency  department and workup here includes white blood cell count of 5.2.  Ultrasound of the abdomen shows gallbladder with some gallstones but no  evidence of acute cholecystitis. He has continued to have some generalized  right sided pain.   PAST MEDICAL HISTORY:  1.  Questionable coronary occlusion, which was clear on cardiac      catheterization.  2.  Hypertension.   MEDICATIONS:  Toprol and Percocet.   PAST SURGICAL HISTORY:  Gynecomastia removal bilaterally.   SOCIAL HISTORY:  He works as a Psychologist, occupational. He does not smoke or drink alcohol.   REVIEW OF SYSTEMS:  CARDIAC:  Negative. PULMONARY:   Negative.  GASTROINTESTINAL:  See the history of present illness. GENITOURINARY:  Some  pain with urination. MUSCULOSKELETAL:  Negative. GENERAL:  Negative.   PHYSICAL EXAMINATION:  VITAL SIGNS:  Temperature 97.9, blood pressure  135/84, pulse 58, respiratory rate 24.  GENERAL:  Awake and alert. No acute distress.  HEENT:  Pupils are reactive. Sclerae clear.  NECK:  Supple. No masses.  LUNGS:  Clear to auscultation.  HEART:  Regular with a impulse palpable in his left chest.  ABDOMEN:  Soft. Bowel sounds are active. He has some mild generalized right  sided abdominal pain with no guarding and he is not impressively tender. His  examination does vary a bit with no tenderness appreciated when he is  speaking. No inguinal hernias are noted.  SKIN:  Warm and dry with no rashes.   LABORATORY DATA:  White blood cell count and abdominal ultrasound results as  seen above. Liver function studies show ALT of 52, AST 29, alkaline  phosphatase 56, bilirubin 0.4, lipase 23.  Urinalysis is negative.   IMPRESSION/PLAN:  Abdominal pain of uncertain etiology. This patient has no  acute surgical issues at this time. I doubt performing cholecystectomy would  relieve his symptoms, as he has no objective evidence of cholecystitis. I am  not impressed with his abdominal tenderness. I spoke with Dr. Read Drivers and he  will contact the patient's primary doctor for possible admission for further  evaluation including GI consultation. This plan was discussed with the  patient and his wife.      Gabrielle Dare Janee Morn, M.D.  Electronically Signed     BET/MEDQ  D:  09/25/2004  T:  09/27/2004  Job:  [id number]   cc:   Paula Libra, MD  Fax: [phone]

## 2010-05-22 NOTE — Assessment & Plan Note (Signed)
[facility]                           GASTROENTEROLOGY OFFICE NOTE   EDWORD, CU                     MRN:          [id number]  DATE:11/16/2005                            DOB:          05-Oct-1970    PRIMARY CARE PHYSICIAN:  Dr. Everrett Coombe.   GI PROBLEM LIST:  1. Constellation of upper and lower GI symptoms.  2. Right upper quadrant discomfort radiating into his left lower quadrant,      postprandial in nature.  3. New loose stools.  4. CT in March, 2007 with IV normal contrast was negative.  5. EGD/colonoscopy by Dr. Charlott Rakes one year ago was normal.   LAB TESTS OCTOBER, 2007:  Normal CBC. Complete metabolic profile normal.  Urinalysis normal. Repeat CT scan October, 2007 with IV normal contrast was  normal.   Status post cholecystectomy approximately one year ago with normal  intraoperative cholangiogram. Repeat EGD in October, 2007 normal. Small  bowel follow through October 13, 2005 normal.   INTERVAL HISTORY:  I last saw Hartman at time of his EGD, that was normal. He  has had quite an extensive but negative work up for his variety of GI  symptoms. He is a very straight shooting young man and is very bothered by  his symptoms although have not yet been able to figure out what this is  from. Today he is telling me that diarrhea is a definite prominent component  to his symptoms and I do not recall that being the case several weeks ago,  but certainly when I first met him in early October, that was the case. He  was recently started on Prevacid. He had been on PPIs before. He does notice  that he is somewhat better on Prevacid.   CURRENT MEDICATIONS:  Prevacid once daily.   PHYSICAL EXAMINATION:  VITAL SIGNS: Weight 285 pounds. Blood pressure  140/96, pulse 76.  CONSTITUTIONAL: Generally well-appearing.  ABDOMEN: Soft, nontender, nondistended, normal bowel sounds.   ASSESSMENT AND PLAN:  A 40 year old man with abdominal  discomfort and loose  stools with etiology as yet undetermined after an extensive work up.   His diarrhea definitely seems to be a more prominent component when he  explains his symptoms to me today and perhaps this Giardia, perhaps mild  lactose intolerance. I will have him get stool testing done today for C.  difficile, white count, ova and parasites, Giardia, stool  culture. After he deposits that then he will empirically try Flagyl 250 mg  t.i.d. for the next 10 days. he will return to see me in 3 weeks and sooner  if needed.     Rachael Fee, MD  Electronically Signed    DPJ/MedQ  DD: 11/16/2005  DT: 11/16/2005  Job #: [id number]   cc:   Billie D. Bean, FNP

## 2010-06-24 ENCOUNTER — Encounter: Payer: Self-pay | Admitting: Cardiovascular Disease

## 2010-10-05 LAB — POCT I-STAT, CHEM 8
BUN: 12
Calcium, Ion: 1.21
Chloride: 105
Creatinine, Ser: 1.5
Glucose, Bld: 95
TCO2: 30

## 2010-10-05 LAB — POCT CARDIAC MARKERS
CKMB, poc: <1 — ABNORMAL LOW
Myoglobin, poc: 66.4
Troponin i, poc: <0.05

## 2012-10-22 ENCOUNTER — Emergency Department (HOSPITAL_COMMUNITY): Payer: Managed Care, Other (non HMO)

## 2012-10-22 ENCOUNTER — Encounter (HOSPITAL_COMMUNITY): Payer: Self-pay | Admitting: Emergency Medicine

## 2012-10-22 ENCOUNTER — Telehealth (HOSPITAL_COMMUNITY): Payer: Self-pay | Admitting: *Deleted

## 2012-10-22 ENCOUNTER — Emergency Department (HOSPITAL_COMMUNITY)
Admission: EM | Admit: 2012-10-22 | Discharge: 2012-10-22 | Disposition: A | Discharge status: Home / Self Care | Payer: Managed Care, Other (non HMO) | Attending: Emergency Medicine | Admitting: Emergency Medicine

## 2012-10-22 DIAGNOSIS — Y92009 Unspecified place in unspecified non-institutional (private) residence as the place of occurrence of the external cause: Secondary | ICD-10-CM | POA: Insufficient documentation

## 2012-10-22 DIAGNOSIS — S060X0A Concussion without loss of consciousness, initial encounter: Secondary | ICD-10-CM | POA: Insufficient documentation

## 2012-10-22 DIAGNOSIS — Z8669 Personal history of other diseases of the nervous system and sense organs: Secondary | ICD-10-CM | POA: Insufficient documentation

## 2012-10-22 DIAGNOSIS — IMO0002 Reserved for concepts with insufficient information to code with codable children: Secondary | ICD-10-CM | POA: Insufficient documentation

## 2012-10-22 DIAGNOSIS — Z79899 Other long term (current) drug therapy: Secondary | ICD-10-CM | POA: Insufficient documentation

## 2012-10-22 DIAGNOSIS — S0990XA Unspecified injury of head, initial encounter: Secondary | ICD-10-CM | POA: Insufficient documentation

## 2012-10-22 DIAGNOSIS — S0993XA Unspecified injury of face, initial encounter: Secondary | ICD-10-CM | POA: Insufficient documentation

## 2012-10-22 DIAGNOSIS — E669 Obesity, unspecified: Secondary | ICD-10-CM | POA: Insufficient documentation

## 2012-10-22 DIAGNOSIS — S93402A Sprain of unspecified ligament of left ankle, initial encounter: Secondary | ICD-10-CM

## 2012-10-22 DIAGNOSIS — S0181XA Laceration without foreign body of other part of head, initial encounter: Secondary | ICD-10-CM

## 2012-10-22 DIAGNOSIS — S93409A Sprain of unspecified ligament of unspecified ankle, initial encounter: Secondary | ICD-10-CM | POA: Insufficient documentation

## 2012-10-22 DIAGNOSIS — H9319 Tinnitus, unspecified ear: Secondary | ICD-10-CM | POA: Insufficient documentation

## 2012-10-22 DIAGNOSIS — Z8719 Personal history of other diseases of the digestive system: Secondary | ICD-10-CM | POA: Insufficient documentation

## 2012-10-22 DIAGNOSIS — Y9361 Activity, american tackle football: Secondary | ICD-10-CM | POA: Insufficient documentation

## 2012-10-22 DIAGNOSIS — Z8709 Personal history of other diseases of the respiratory system: Secondary | ICD-10-CM | POA: Insufficient documentation

## 2012-10-22 DIAGNOSIS — Z8659 Personal history of other mental and behavioral disorders: Secondary | ICD-10-CM | POA: Insufficient documentation

## 2012-10-22 DIAGNOSIS — W219XXA Striking against or struck by unspecified sports equipment, initial encounter: Secondary | ICD-10-CM | POA: Insufficient documentation

## 2012-10-22 DIAGNOSIS — I10 Essential (primary) hypertension: Secondary | ICD-10-CM | POA: Insufficient documentation

## 2012-10-22 DIAGNOSIS — S025XXA Fracture of tooth (traumatic), initial encounter for closed fracture: Secondary | ICD-10-CM | POA: Insufficient documentation

## 2012-10-22 DIAGNOSIS — Z23 Encounter for immunization: Secondary | ICD-10-CM | POA: Insufficient documentation

## 2012-10-22 DIAGNOSIS — Z872 Personal history of diseases of the skin and subcutaneous tissue: Secondary | ICD-10-CM | POA: Insufficient documentation

## 2012-10-22 DIAGNOSIS — S01501A Unspecified open wound of lip, initial encounter: Secondary | ICD-10-CM | POA: Insufficient documentation

## 2012-10-22 MED ORDER — TETANUS-DIPHTH-ACELL PERTUSSIS 5-2.5-18.5 LF-MCG/0.5 IM SUSP
0.5000 mL | Freq: Once | INTRAMUSCULAR | Status: AC
  Administered 2012-10-22: 0.5 mL via INTRAMUSCULAR
  Filled 2012-10-22: qty 0.5

## 2012-10-22 MED ORDER — OXYCODONE-ACETAMINOPHEN 5-325 MG PO TABS
1.0000 | ORAL_TABLET | Freq: Once | ORAL | Status: AC
  Administered 2012-10-22: 1 via ORAL
  Filled 2012-10-22: qty 1

## 2012-10-22 MED ORDER — OXYCODONE-ACETAMINOPHEN 5-325 MG PO TABS: 1.0000 | ORAL_TABLET | Freq: Once | ORAL | Status: DC

## 2012-10-22 NOTE — ED Provider Notes (Signed)
CSN: [id number]     Arrival date & time 10/22/12  1958 History   First MD Initiated Contact with Patient 10/22/12 2012    This chart was scribed for non-physician practitioner, Elpidio Anis PA-C,  working with Lyanne Co, MD by Arlan Organ, ED Scribe. This patient was seen in room WTR9/WTR9 and the patient's care was started at 8:24 PM.   Chief Complaint  Patient presents with  . Facial Laceration  . Head Injury   The history is provided by the patient and the spouse. No language interpreter was used.   HPI Comments: Bradley Wells is a 43 y.o. male who presents to the Emergency Department complaining of a facial laceration and a head injury that occurred today at 7 PM. Pt states he was playing football and hit his head on the concrete. Pts wife states he cracked his tooth and bit through his lip. Pt states at time of impact he experiences dizziness, and had to sit down. Pt denies LOC, abdominal pain, CP. Pt is allergic to morphine and dilaudid . Pt not aware of last tetanus shot.  Past Medical History  Diagnosis Date  . Chest pain, unspecified   . Unspecified essential hypertension   . Syncope and collapse   . Obesity, unspecified   . Unspecified sleep apnea   . Pain in joint, shoulder region     right  . Allergic rhinitis, cause unspecified   . Anxiety state, unspecified   . Abdominal pain, unspecified site   . Acute upper respiratory infections of unspecified site   . Anal or rectal pain   . Pain in limb     left foot  . Pain in joint, ankle and foot     left  . Calculus of gallbladder without mention of cholecystitis or obstruction   . Unspecified gastritis and gastroduodenitis without mention of hemorrhage     mild  . Diverticulosis of colon (without mention of hemorrhage)   . Special screening for malignant neoplasms of other sites   . Benign tumor of breast     bilaterl; removed as teen    Past Surgical History  Procedure Laterality Date  . Mri  09/03/03     MRI Cspine and Tspine negative  . Cholecystectomy  9/06    laprascopic  . Abd gallstones  9/22-28/06    [facility]  . Ct scan  8/06    abd/pelvis  . U/s abd  9/06  . Ugi series  9/06    with follow-through  . Esophagogastroduodenoscopy  9/06  . Colonoscopy  9/06    negative  . Cardiac catheterization  10/10/07    coronaries clean, EF  40-45%  . Shoulder arthroscopy  11/19/08    [facility]    Family History  Problem Relation Age of Onset  . Diabetes Brother     and sister - DM2   History  Substance Use Topics  . Smoking status: Unknown If Ever Smoked  . Smokeless tobacco: Not on file     Comment: does not smoke  . Alcohol Use: No    Review of Systems  Constitutional: Negative for fever.  HENT: Positive for tinnitus.   Eyes: Positive for photophobia and visual disturbance.  Respiratory: Negative for shortness of breath.   Cardiovascular: Negative for chest pain.  Gastrointestinal: Positive for nausea. Negative for vomiting and abdominal pain.  Musculoskeletal: Positive for neck pain. Negative for back pain.       Left ankle pain.  Skin: Positive  for wound (facial laceration).  Neurological: Positive for dizziness and headaches. Negative for syncope.  Psychiatric/Behavioral: Negative for confusion.    Allergies  Hydromorphone hcl and Morphine sulfate  Home Medications   Current Outpatient Rx  Name  Route  Sig  Dispense  Refill  . acetaminophen (TYLENOL) 500 MG tablet   Oral   Take 500 mg by mouth every 6 (six) hours as needed.           Marland Kitchen ibuprofen (ADVIL,MOTRIN) 200 MG tablet   Oral   Take 200 mg by mouth every 6 (six) hours as needed. OTC UAD          . lisinopril (PRINIVIL,ZESTRIL) 20 MG tablet   Oral   Take 20 mg by mouth daily.           . mometasone (NASONEX) 50 MCG/ACT nasal spray   Nasal   Place 2 sprays into the nose 2 (two) times daily.           Marland Kitchen olopatadine (PATANOL) 0.1 % ophthalmic solution   Both Eyes   Place 1 drop into both eyes  daily.            BP 159/100  Pulse 104  Temp(Src) 98.1 F (36.7 C) (Oral)  Resp 22  Ht 6\' 3"  (1.905 m)  Wt 280 lb (127.007 kg)  BMI 35 kg/m2  SpO2 98% Physical Exam  Nursing note and vitals reviewed. Constitutional: He is oriented to person, place, and time. He appears well-developed and well-nourished.  HENT:  Head: Normocephalic.  Upper right incisor with visualized transverse fracture line, tooth intact No tenderness to maxillary plate No malocclusion  Lower dentition stable but tender  Eyes: EOM are normal. Pupils are equal, round, and reactive to light.  Neck: Normal range of motion. Neck supple.  Cardiovascular: Normal rate.   Pulmonary/Chest: Effort normal. He exhibits no tenderness.  Abdominal: There is no tenderness.  Musculoskeletal: Normal range of motion. He exhibits no edema.  No midline cervical tenderness. There is right lateral neck tenderness without swelling or discoloration. FROM neck. Left ankle minimally swollen without discoloration or bony deformity.   Neurological: He is alert and oriented to person, place, and time.  Cranial nerves 3-12 grossly intact. Ambulatory without imbalance.   Skin: Skin is warm and dry.  Laceration buccal surface the lower lip With corresponding  external laceration  Psychiatric: He has a normal mood and affect. His behavior is normal.    ED Course  Procedures (including critical care time)  DIAGNOSTIC STUDIES: Oxygen Saturation is 98% on RA, normal by my interpretation.    COORDINATION OF CARE: 8:24 PM-Will give pain medication. Will order CT. Discussed treatment plan with pt at bedside and pt agreed to plan.     Labs Review Labs Reviewed - No data to display Imaging Review Dg Orthopantogram  10/22/2012   CLINICAL DATA:  Fall. Dental injury  EXAM: ORTHOPANTOGRAM/PANORAMIC  COMPARISON:  None.  FINDINGS: Negative for fracture of the mandible. There is blurring of the teeth however no obvious dental injury is  identified.  IMPRESSION: Negative   Electronically Signed   By: Marlan Palau M.D.   On: 10/22/2012 21:12   Dg Ankle Complete Left  10/22/2012   CLINICAL DATA:  Fall. Pain  EXAM: LEFT ANKLE COMPLETE - 3+ VIEW  COMPARISON:  None.  FINDINGS: Normal alignment no fracture. No significant degenerative change or effusion. Calcification in the Achilles tendon is noted.  IMPRESSION: Negative for fracture.   Electronically Signed  By: Marlan Palau M.D.   On: 10/22/2012 22:11   Ct Head Wo Contrast  10/22/2012   CLINICAL DATA:  Facial laceration. Head injury.  EXAM: CT HEAD WITHOUT CONTRAST  TECHNIQUE: Contiguous axial images were obtained from the base of the skull through the vertex without intravenous contrast.  COMPARISON:  04/20/2009  FINDINGS: Ventricle size is normal. Negative for hemorrhage, mass, or infarct. The skull is normal.  IMPRESSION: Normal   Electronically Signed   By: Marlan Palau M.D.   On: 10/22/2012 21:05  LACERATION REPAIR Performed by: Elpidio Anis A Authorized by: Elpidio Anis A Consent: Verbal consent obtained. Risks and benefits: risks, benefits and alternatives were discussed Consent given by: patient Patient identity confirmed: provided demographic data Prepped and Draped in normal sterile fashion Wound explored  Laceration Location: lower lip  Laceration Length: less than 1 cm  No Foreign Bodies seen or palpated  Anesthesia: local infiltration  Local anesthetic: lidocaine 2% w/ epinephrine  Anesthetic total: less than 1  ml  Irrigation method: syringe Amount of cleaning: standard  Skin closure: prolene 6-0  Number of sutures: 1  Technique: simple interrupted  Patient tolerance: Patient tolerated the procedure well with no immediate complications.   EKG Interpretation   None       MDM  No diagnosis found. 1. Mild concussion 2. Facial laceration 3. Dental injury 4. Ankle sprain    Arnoldo Hooker, PA-C 10/22/12 2230

## 2012-10-22 NOTE — ED Provider Notes (Signed)
Medical screening examination/treatment/procedure(s) were performed by non-physician practitioner and as supervising physician I was immediately available for consultation/collaboration.  Lyanne Co, MD 10/22/12 2233

## 2012-10-22 NOTE — ED Notes (Signed)
Bed: WTR7 Expected date: 10/22/12 Expected time: 7:49 PM Means of arrival: Ambulance Comments: Laceration to leg

## 2012-10-22 NOTE — ED Notes (Signed)
Pharmacy calling to get sig on Rx for percocet.  Per Elpidio Anis PA-C, take 1-2 tabs, PO q 4 hrs PRN pain.

## 2012-10-22 NOTE — ED Notes (Signed)
Pt to ER via EMS after facial injury; pt was playing football in the yard and dove for the ball striking his face on the ground; denies LOC; pt c/o eyes ringing, headache and chin pain; pt has a through the bottom lip laceration; pt also c/o left ankle pain; pt also has a partially broken top front tooth

## 2015-06-23 ENCOUNTER — Other Ambulatory Visit: Payer: Self-pay

## 2015-06-23 ENCOUNTER — Emergency Department (HOSPITAL_COMMUNITY): Payer: 59

## 2015-06-23 ENCOUNTER — Emergency Department (HOSPITAL_COMMUNITY)
Admission: EM | Admit: 2015-06-23 | Discharge: 2015-06-23 | Disposition: A | Discharge status: Home / Self Care | Payer: 59 | Attending: Emergency Medicine | Admitting: Emergency Medicine

## 2015-06-23 ENCOUNTER — Encounter (HOSPITAL_COMMUNITY): Payer: Self-pay

## 2015-06-23 DIAGNOSIS — R55 Syncope and collapse: Secondary | ICD-10-CM | POA: Diagnosis not present

## 2015-06-23 DIAGNOSIS — I10 Essential (primary) hypertension: Secondary | ICD-10-CM | POA: Diagnosis not present

## 2015-06-23 DIAGNOSIS — Z79899 Other long term (current) drug therapy: Secondary | ICD-10-CM | POA: Insufficient documentation

## 2015-06-23 LAB — CBC WITH DIFFERENTIAL/PLATELET
BASOS ABS: 0 10*3/uL (ref 0.0–0.1)
BASOS PCT: 0 %
EOS PCT: 1 %
Eosinophils Absolute: 0.1 10*3/uL (ref 0.0–0.7)
HEMATOCRIT: 36.2 % — AB (ref 39.0–52.0)
Hemoglobin: 11.9 g/dL — ABNORMAL LOW (ref 13.0–17.0)
Lymphocytes Relative: 45 %
Lymphs Abs: 2.7 10*3/uL (ref 0.7–4.0)
MCH: 27 pg (ref 26.0–34.0)
MCHC: 32.9 g/dL (ref 30.0–36.0)
MCV: 82.1 fL (ref 78.0–100.0)
MONO ABS: 0.5 10*3/uL (ref 0.1–1.0)
MONOS PCT: 8 %
NEUTROS ABS: 2.8 10*3/uL (ref 1.7–7.7)
Neutrophils Relative %: 46 %
PLATELETS: 338 10*3/uL (ref 150–400)
RBC: 4.41 MIL/uL (ref 4.22–5.81)
RDW: 15.4 % (ref 11.5–15.5)
WBC: 6.1 10*3/uL (ref 4.0–10.5)

## 2015-06-23 LAB — COMPREHENSIVE METABOLIC PANEL
ALBUMIN: 4.2 g/dL (ref 3.5–5.0)
ALT: 21 U/L (ref 17–63)
ANION GAP: 5 (ref 5–15)
AST: 15 U/L (ref 15–41)
Alkaline Phosphatase: 52 U/L (ref 38–126)
BILIRUBIN TOTAL: 0.4 mg/dL (ref 0.3–1.2)
BUN: 12 mg/dL (ref 6–20)
CHLORIDE: 109 mmol/L (ref 101–111)
CO2: 26 mmol/L (ref 22–32)
Calcium: 8.7 mg/dL — ABNORMAL LOW (ref 8.9–10.3)
Creatinine, Ser: 1.82 mg/dL — ABNORMAL HIGH (ref 0.61–1.24)
GFR calc Af Amer: 51 mL/min — ABNORMAL LOW (ref >60)
GFR, EST NON AFRICAN AMERICAN: 44 mL/min — AB (ref >60)
Glucose, Bld: 124 mg/dL — ABNORMAL HIGH (ref 65–99)
POTASSIUM: 4 mmol/L (ref 3.5–5.1)
Sodium: 140 mmol/L (ref 135–145)
TOTAL PROTEIN: 7.5 g/dL (ref 6.5–8.1)

## 2015-06-23 LAB — URINALYSIS, ROUTINE W REFLEX MICROSCOPIC
Bilirubin Urine: NEGATIVE
Glucose, UA: NEGATIVE mg/dL
Hgb urine dipstick: NEGATIVE
KETONES UR: NEGATIVE mg/dL
LEUKOCYTES UA: NEGATIVE
NITRITE: NEGATIVE
PROTEIN: NEGATIVE mg/dL
Specific Gravity, Urine: 1.016 (ref 1.005–1.030)
pH: 6 (ref 5.0–8.0)

## 2015-06-23 LAB — I-STAT TROPONIN, ED: TROPONIN I, POC: 0.06 ng/mL (ref 0.00–0.08)

## 2015-06-23 LAB — CBG MONITORING, ED: GLUCOSE-CAPILLARY: 124 mg/dL — AB (ref 65–99)

## 2015-06-23 LAB — TROPONIN I: Troponin I: <0.03 ng/mL (ref <0.031)

## 2015-06-23 MED ORDER — SODIUM CHLORIDE 0.9 % IV SOLN
INTRAVENOUS | Status: DC
  Administered 2015-06-23: 11:00:00 via INTRAVENOUS

## 2015-06-23 MED ORDER — ACETAMINOPHEN 500 MG PO TABS
1000.0000 mg | ORAL_TABLET | Freq: Once | ORAL | Status: AC
  Administered 2015-06-23: 1000 mg via ORAL
  Filled 2015-06-23: qty 2

## 2015-06-23 MED ORDER — SODIUM CHLORIDE 0.9 % IV BOLUS (SEPSIS)
1000.0000 mL | Freq: Once | INTRAVENOUS | Status: AC
Start: 2015-06-23 — End: 2015-06-23
  Administered 2015-06-23: 1000 mL via INTRAVENOUS

## 2015-06-23 MED ORDER — AMLODIPINE BESYLATE 5 MG PO TABS: 5.0000 mg | ORAL_TABLET | Freq: Every day | ORAL | Status: AC

## 2015-06-23 NOTE — ED Provider Notes (Signed)
CSN: [id number]     Arrival date & time 06/23/15  0900 History   First MD Initiated Contact with Patient 06/23/15 [phone]     Chief Complaint  Patient presents with  . Loss of Consciousness     (Consider location/radiation/quality/duration/timing/severity/associated sxs/prior Treatment) HPI Patient presents after an episode of syncope. Patient recalls awakening, and is typical manner, and while walking to the bathroom felt dizzy, lightheaded, lost consciousness. No subsequent trauma, as the patient fell onto a carpeted floor. Upon awakening he had no chest pain, does have headache, persistent dizziness. Patient states that he is generally well aside from history of hypertension for which he takes lisinopril. The dosage of this was recently increased. Over the past week he has noticed new dizziness, both at rest and with activity, but no new chest pain, dyspnea or other noticeable changes from baseline medical condition. Patient does not smoke, does not drink, works as a Freight forwarder in a call center. Currently he has no physical complaint beyond mild diffuse headache, dizziness.  Past Medical History  Diagnosis Date  . Chest pain, unspecified   . Unspecified essential hypertension   . Syncope and collapse   . Obesity, unspecified   . Unspecified sleep apnea   . Pain in joint, shoulder region     right  . Allergic rhinitis, cause unspecified   . Anxiety state, unspecified   . Abdominal pain, unspecified site   . Acute upper respiratory infections of unspecified site   . Anal or rectal pain   . Pain in limb     left foot  . Pain in joint, ankle and foot     left  . Calculus of gallbladder without mention of cholecystitis or obstruction   . Unspecified gastritis and gastroduodenitis without mention of hemorrhage     mild  . Diverticulosis of colon (without mention of hemorrhage)   . Special screening for malignant neoplasms of other sites   . Benign tumor of breast     bilaterl;  removed as teen    Past Surgical History  Procedure Laterality Date  . Mri  09/03/03    MRI Cspine and Tspine negative  . Cholecystectomy  9/06    laprascopic  . Abd gallstones  9/22-28/06    [facility]  . Ct scan  8/06    abd/pelvis  . U/s abd  9/06  . Ugi series  9/06    with follow-through  . Esophagogastroduodenoscopy  9/06  . Colonoscopy  9/06    negative  . Cardiac catheterization  10/10/07    coronaries clean, EF  40-45%  . Shoulder arthroscopy  11/19/08    [facility]    Family History  Problem Relation Age of Onset  . Diabetes Brother     and sister - DM2   Social History  Substance Use Topics  . Smoking status: Unknown If Ever Smoked  . Smokeless tobacco: None     Comment: does not smoke  . Alcohol Use: No    Review of Systems  Constitutional:       Per HPI, otherwise negative  HENT:       Per HPI, otherwise negative  Respiratory:       Per HPI, otherwise negative  Cardiovascular:       Per HPI, otherwise negative  Gastrointestinal: Negative for vomiting.  Endocrine:       Negative aside from HPI  Genitourinary:       Neg aside from HPI   Musculoskeletal:  Per HPI, otherwise negative  Skin: Negative.   Neurological: Positive for syncope.      Allergies  Hydromorphone hcl and Morphine sulfate  Home Medications   Prior to Admission medications   Medication Sig Start Date End Date Taking? Authorizing Provider  lisinopril (PRINIVIL,ZESTRIL) 20 MG tablet Take 20 mg by mouth every morning.     Historical Provider, MD  oxyCODONE-acetaminophen (PERCOCET/ROXICET) 5-325 MG per tablet Take 1-2 tablets by mouth once. 10/22/12   Shari Upstill, PA-C   BP 137/89 mmHg  Pulse 76  Resp 20  Ht 6' 3.5" (1.918 m)  Wt 190 lb (86.183 kg)  BMI 23.43 kg/m2  SpO2 100% Physical Exam  Constitutional: He is oriented to person, place, and time. He appears well-developed. No distress.  HENT:  Head: Normocephalic and atraumatic.  Eyes: Conjunctivae and EOM are  normal.  Cardiovascular: Normal rate and regular rhythm.   Pulmonary/Chest: Effort normal. No stridor. No respiratory distress.  Abdominal: He exhibits no distension.  Musculoskeletal: He exhibits no edema.  Neurological: He is alert and oriented to person, place, and time. No cranial nerve deficit. He exhibits normal muscle tone. Coordination normal.  Skin: Skin is warm and dry.  Psychiatric: He has a normal mood and affect.  Nursing note and vitals reviewed.   ED Course  Procedures (including critical care time) Labs Review Labs Reviewed  CBC WITH DIFFERENTIAL/PLATELET - Abnormal; Notable for the following:    Hemoglobin 11.9 (*)    HCT 36.2 (*)    All other components within normal limits  COMPREHENSIVE METABOLIC PANEL - Abnormal; Notable for the following:    Glucose, Bld 124 (*)    Creatinine, Ser 1.82 (*)    Calcium 8.7 (*)    GFR calc non Af Amer 44 (*)    GFR calc Af Amer 51 (*)    All other components within normal limits  CBG MONITORING, ED - Abnormal; Notable for the following:    Glucose-Capillary 124 (*)    All other components within normal limits  TROPONIN I  URINALYSIS, ROUTINE W REFLEX MICROSCOPIC (NOT AT [facility])  TROPONIN I  POCT CBG (FASTING - GLUCOSE)-MANUAL ENTRY  I-STAT TROPOININ, ED    Imaging Review No results found. I have personally reviewed and evaluated these images and lab results as part of my medical decision-making.  EKG with sinus rhythm rate 77, PVC, mild T-wave changes, artifacts, borderline  11:49 AM Patient calm, now accompanied by his wife. We discussed initial findings, including elevated creatinine. Patient has mild headache.  2:20 PM No ongoing complaints. Had a lengthy conversation with the patient and his wife about thoughts, findings, need for continued evaluation as an outpatient. With slightly elevated creatinine, patient's description of dizziness, there is some suspicion for dehydration, ongoing renal dysfunction is  contributory. Patient will decrease his amlodipine back to 5 mg, follow up with primary care for discussion.  MDM   Final diagnoses:  Syncope and collapse   Patient presents after an episode of syncope. Patient had some dizziness in the days prior, and headache today, CT was performed in addition to typical labs, x-ray, EKG. Patient had 2 negative troponins, nonischemic EKG, ACS unlikely. No risk factors for PE. No ongoing neurologic dysfunction, normal CTA reassuring. Patient does have slight elevation in his creatinine, and recently changed his dosage of antihypertensive. Patient was encouraged to go back to his typical dose, with consideration of possible iatrogenic component of his syncope.  Patient will follow up with cardiology for Holter monitoring,  primary care for additional evaluation of his renal dysfunction, hypertension.   Carmin Muskrat, MD 06/23/15 [phone]

## 2015-06-23 NOTE — ED Notes (Signed)
Patient transported to CT 

## 2015-06-23 NOTE — Discharge Instructions (Signed)
Today's evaluation has been largely reassuring, but it is important to follow-up with both your primary care physician and our cardiology colleagues.  When you speak with the cardiology team, please be sure to mention that you're being evaluated for syncope, or passing out.  Return here for concerning changes in your condition.   Cardiac Event Monitoring A cardiac event monitor is a small recording device used to help detect abnormal heart rhythms (arrhythmias). The monitor is used to record heart rhythm when noticeable symptoms such as the following occur:  Fast heartbeats (palpitations), such as heart racing or fluttering.  Dizziness.  Fainting or light-headedness.  Unexplained weakness. The monitor is wired to two electrodes placed on your chest. Electrodes are flat, sticky disks that attach to your skin. The monitor can be worn for up to 30 days. You will wear the monitor at all times, except when bathing.  HOW TO USE YOUR CARDIAC EVENT MONITOR A technician will prepare your chest for the electrode placement. The technician will show you how to place the electrodes, how to work the monitor, and how to replace the batteries. Take time to practice using the monitor before you leave the office. Make sure you understand how to send the information from the monitor to your health care provider. This requires a telephone with a landline, not a cell phone. You need to:  Wear your monitor at all times, except when you are in water:  Do not get the monitor wet.  Take the monitor off when bathing. Do not swim or use a hot tub with it on.  Keep your skin clean. Do not put body lotion or moisturizer on your chest.  Change the electrodes daily or any time they stop sticking to your skin. You might need to use tape to keep them on.  It is possible that your skin under the electrodes could become irritated. To keep this from happening, try to put the electrodes in slightly different places on  your chest. However, they must remain in the area under your left breast and in the upper right section of your chest.  Make sure the monitor is safely clipped to your clothing or in a location close to your body that your health care provider recommends.  Press the button to record when you feel symptoms of heart trouble, such as dizziness, weakness, light-headedness, palpitations, thumping, shortness of breath, unexplained weakness, or a fluttering or racing heart. The monitor is always on and records what happened slightly before you pressed the button, so do not worry about being too late to get good information.  Keep a diary of your activities, such as walking, doing chores, and taking medicine. It is especially important to note what you were doing when you pushed the button to record your symptoms. This will help your health care provider determine what might be contributing to your symptoms. The information stored in your monitor will be reviewed by your health care provider alongside your diary entries.  Send the recorded information as recommended by your health care provider. It is important to understand that it will take some time for your health care provider to process the results.  Change the batteries as recommended by your health care provider. SEEK IMMEDIATE MEDICAL CARE IF:   You have chest pain.  You have extreme difficulty breathing or shortness of breath.  You develop a very fast heartbeat that persists.  You develop dizziness that does not go away.  You faint or constantly feel  you are about to faint.   This information is not intended to replace advice given to you by your health care provider. Make sure you discuss any questions you have with your health care provider.   Document Released: 09/30/2007 Document Revised: 01/11/2014 Document Reviewed: 06/19/2012 Elsevier Interactive Patient Education 2016 [employer].  Syncope Syncope is a medical term for  fainting or passing out. This means you lose consciousness and drop to the ground. People are generally unconscious for less than 5 minutes. You may have some muscle twitches for up to 15 seconds before waking up and returning to normal. Syncope occurs more often in older adults, but it can happen to anyone. While most causes of syncope are not dangerous, syncope can be a sign of a serious medical problem. It is important to seek medical care.  CAUSES  Syncope is caused by a sudden drop in blood flow to the brain. The specific cause is often not determined. Factors that can bring on syncope include:  Taking medicines that lower blood pressure.  Sudden changes in posture, such as standing up quickly.  Taking more medicine than prescribed.  Standing in one place for too long.  Seizure disorders.  Dehydration and excessive exposure to heat.  Low blood sugar (hypoglycemia).  Straining to have a bowel movement.  Heart disease, irregular heartbeat, or other circulatory problems.  Fear, emotional distress, seeing blood, or severe pain. SYMPTOMS  Right before fainting, you may:  Feel dizzy or light-headed.  Feel nauseous.  See all white or all black in your field of vision.  Have cold, clammy skin. DIAGNOSIS  Your health care provider will ask about your symptoms, perform a physical exam, and perform an electrocardiogram (ECG) to record the electrical activity of your heart. Your health care provider may also perform other heart or blood tests to determine the cause of your syncope which may include:  Transthoracic echocardiogram (TTE). During echocardiography, sound waves are used to evaluate how blood flows through your heart.  Transesophageal echocardiogram (TEE).  Cardiac monitoring. This allows your health care provider to monitor your heart rate and rhythm in real time.  Holter monitor. This is a portable device that records your heartbeat and can help diagnose heart  arrhythmias. It allows your health care provider to track your heart activity for several days, if needed.  Stress tests by exercise or by giving medicine that makes the heart beat faster. TREATMENT  In most cases, no treatment is needed. Depending on the cause of your syncope, your health care provider may recommend changing or stopping some of your medicines. HOME CARE INSTRUCTIONS  Have someone stay with you until you feel stable.  Do not drive, use machinery, or play sports until your health care provider says it is okay.  Keep all follow-up appointments as directed by your health care provider.  Lie down right away if you start feeling like you might faint. Breathe deeply and steadily. Wait until all the symptoms have passed.  Drink enough fluids to keep your urine clear or pale yellow.  If you are taking blood pressure or heart medicine, get up slowly and take several minutes to sit and then stand. This can reduce dizziness. SEEK IMMEDIATE MEDICAL CARE IF:   You have a severe headache.  You have unusual pain in the chest, abdomen, or back.  You are bleeding from your mouth or rectum, or you have black or tarry stool.  You have an irregular or very fast heartbeat.  You  have pain with breathing.  You have repeated fainting or seizure-like jerking during an episode.  You faint when sitting or lying down.  You have confusion.  You have trouble walking.  You have severe weakness.  You have vision problems. If you fainted, call your local emergency services (911 in [place] Do not drive yourself to the hospital.    This information is not intended to replace advice given to you by your health care provider. Make sure you discuss any questions you have with your health care provider.   Document Released: 12/21/2004 Document Revised: 05/07/2014 Document Reviewed: 02/19/2011 Elsevier Interactive Patient Education [employer].

## 2015-06-23 NOTE — ED Notes (Signed)
Pt states normal morning. Pt last memory walking to bathroom and awoke to wife stating "what happened". No recall to event. No trauma noted. EDP Vanita Panda evaluated during triage. C/o of generalized headache only

## 2015-06-23 NOTE — ED Notes (Signed)
Patient transported to X-ray 

## 2015-07-17 ENCOUNTER — Ambulatory Visit: Payer: Managed Care, Other (non HMO) | Admitting: Internal Medicine

## 2016-04-23 ENCOUNTER — Encounter (HOSPITAL_COMMUNITY): Payer: Self-pay | Admitting: Oncology

## 2016-04-23 ENCOUNTER — Emergency Department (HOSPITAL_COMMUNITY)
Admission: EM | Admit: 2016-04-23 | Discharge: 2016-04-24 | Disposition: A | Discharge status: Home / Self Care | Payer: 59 | Attending: Emergency Medicine | Admitting: Emergency Medicine

## 2016-04-23 DIAGNOSIS — R112 Nausea with vomiting, unspecified: Secondary | ICD-10-CM

## 2016-04-23 DIAGNOSIS — I10 Essential (primary) hypertension: Secondary | ICD-10-CM | POA: Diagnosis not present

## 2016-04-23 DIAGNOSIS — R197 Diarrhea, unspecified: Secondary | ICD-10-CM | POA: Insufficient documentation

## 2016-04-23 NOTE — ED Provider Notes (Signed)
[facility] DEPT Provider Note   CSN: [id number] Arrival date & time: 04/23/16  2029  By signing my name below, I, Collene Leyden, attest that this documentation has been prepared under the direction and in the presence of Reema Chick, MD. Electronically Signed: Collene Leyden, Scribe. 04/23/16. 12:10 AM.  History   Chief Complaint Chief Complaint  Patient presents with  . Abdominal Pain    HPI Comments: PAXTEN APPELT is a 46 y.o. male with a history of a calculus of the gallbladder, who presents to the Emergency Department complaining of sudden-onset, constant abdominal pain that began earlier today. Patient states she was eating a chicken breast at [facility], in which she believes was not fully cooked. Patient reports abdominal pain and 1 episode of vomiting prior to leaving the restaurant. Patient reports associated nausea, vomiting, and flatus. No modifying factors indicated. Patient denies any diarrhea, fever, chills, or any other symptoms.   The history is provided by the patient. No language interpreter was used.  Abdominal Pain   This is a new problem. The current episode started 6 to 12 hours ago. The problem occurs constantly. The problem has not changed since onset.The pain is associated with an unknown factor. The pain is located in the generalized abdominal region. The patient is experiencing no pain. Associated symptoms include flatus, nausea and vomiting. Pertinent negatives include fever, diarrhea and constipation. Nothing aggravates the symptoms. Nothing relieves the symptoms. Past workup does not include GI consult. His past medical history does not include PUD.    Past Medical History:  Diagnosis Date  . Abdominal pain, unspecified site   . Acute upper respiratory infections of unspecified site   . Allergic rhinitis, cause unspecified   . Anal or rectal pain   . Anxiety state, unspecified   . Benign tumor of breast    bilaterl; removed as teen   . Calculus of  gallbladder without mention of cholecystitis or obstruction   . Chest pain, unspecified   . Diverticulosis of colon (without mention of hemorrhage)   . Obesity, unspecified   . Pain in joint, ankle and foot    left  . Pain in joint, shoulder region    right  . Pain in limb    left foot  . Special screening for malignant neoplasms of other sites   . Syncope and collapse   . Unspecified essential hypertension   . Unspecified gastritis and gastroduodenitis without mention of hemorrhage    mild  . Unspecified sleep apnea     Patient Active Problem List   Diagnosis Date Noted  . KNEE PAIN, RIGHT 07/04/2008  . SHOULDER PAIN, RIGHT 05/21/2008  . ALLERGIC RHINITIS CAUSE UNSPECIFIED 03/28/2008  . ANXIETY 11/15/2007  . URI 09/28/2007  . ABDOMINAL PAIN 09/28/2007  . RECTAL PAIN 05/26/2007  . SYNCOPE 05/26/2007  . FOOT PAIN, LEFT 02/03/2007  . OBESITY 01/18/2007  . ANKLE PAIN, LEFT 01/18/2007  . HYPERTENSION 07/29/2006  . DIVERTICULOSIS, COLON 07/29/2006  . CHOLELITHIASIS 07/29/2006  . SLEEP APNEA 07/29/2006  . CHEST PAIN 07/29/2006    Past Surgical History:  Procedure Laterality Date  . abd gallstones  9/22-28/06   [facility]  . CARDIAC CATHETERIZATION  10/10/07   coronaries clean, EF  40-45%  . CHOLECYSTECTOMY  9/06   laprascopic  . COLONOSCOPY  9/06   negative  . CT SCAN  8/06   abd/pelvis  . ESOPHAGOGASTRODUODENOSCOPY  9/06  . MRI  09/03/03   MRI Cspine and Tspine negative  . SHOULDER ARTHROSCOPY  11/19/08   Dalldorf   . u/s abd  9/06  . UGI series  9/06   with follow-through       Home Medications    Prior to Admission medications   Medication Sig Start Date End Date Taking? Authorizing Provider  amLODipine (NORVASC) 5 MG tablet Take 1 tablet (5 mg total) by mouth daily. 06/23/15   Carmin Muskrat, MD  Cholecalciferol (VITAMIN D PO) Take 1 tablet by mouth daily.    Historical Provider, MD  Cyanocobalamin (VITAMIN B-12 PO) Take 1 tablet by mouth daily.    Historical  Provider, MD  Multiple Vitamins-Minerals (MULTIVITAMIN WITH MINERALS) tablet Take 1 tablet by mouth daily.    Historical Provider, MD    Family History Family History  Problem Relation Age of Onset  . Diabetes Brother     and sister - DM2    Social History Social History  Substance Use Topics  . Smoking status: Never Smoker  . Smokeless tobacco: Never Used     Comment: does not smoke  . Alcohol use No     Allergies   Hydromorphone hcl; Morphine sulfate; Other; and Coconut oil   Review of Systems Review of Systems  Constitutional: Negative for chills and fever.  Gastrointestinal: Positive for abdominal pain, flatus, nausea and vomiting. Negative for constipation and diarrhea.  All other systems reviewed and are negative.    Physical Exam Updated Vital Signs BP (!) 139/93 (BP Location: Left Arm)   Pulse 81   Temp 97.8 F (36.6 C) (Oral)   Resp 12   Ht 6' 3.5" (1.918 m)   Wt 285 lb (129.3 kg)   SpO2 100%   BMI 35.15 kg/m   Physical Exam  Constitutional: He is oriented to person, place, and time. He appears well-developed and well-nourished. No distress.  HENT:  Head: Normocephalic and atraumatic.  Mouth/Throat: Oropharynx is clear and moist.  Eyes: Conjunctivae and EOM are normal. Pupils are equal, round, and reactive to light.  Neck: Normal range of motion. Neck supple.  Cardiovascular: Normal rate, regular rhythm, normal heart sounds and intact distal pulses.   Pulmonary/Chest: Effort normal and breath sounds normal. He has no wheezes. He has no rales.  Abdominal: Soft. He exhibits no mass. There is no tenderness. There is no rebound and no guarding.  Gassy, hyperactive throughout.   Musculoskeletal: Normal range of motion. He exhibits no edema.  Neurological: He is alert and oriented to person, place, and time. He displays normal reflexes.  Skin: Skin is warm and dry. Capillary refill takes less than 2 seconds.  Psychiatric: He has a normal mood and affect.   Nursing note and vitals reviewed.    ED Treatments / Results   Vitals:   04/23/16 2347 04/24/16 0152  BP: 128/79 131/83  Pulse: 68 66  Resp: 18 18  Temp:      DIAGNOSTIC STUDIES: Oxygen Saturation is 100% on RA, normal by my interpretation.    COORDINATION OF CARE: 12:08 AM Discussed treatment plan with pt at bedside and pt agreed to plan.   Labs (all labs ordered are listed, but only abnormal results are displayed) Results for orders placed or performed during the hospital encounter of 04/23/16  Lipase, blood  Result Value Ref Range   Lipase 24 11 - 51 U/L  Comprehensive metabolic panel  Result Value Ref Range   Sodium 140 135 - 145 mmol/L   Potassium 4.1 3.5 - 5.1 mmol/L   Chloride 105 101 - 111 mmol/L  CO2 27 22 - 32 mmol/L   Glucose, Bld 109 (H) 65 - 99 mg/dL   BUN 16 6 - 20 mg/dL   Creatinine, Ser 1.69 (H) 0.61 - 1.24 mg/dL   Calcium 8.8 (L) 8.9 - 10.3 mg/dL   Total Protein 7.0 6.5 - 8.1 g/dL   Albumin 3.9 3.5 - 5.0 g/dL   AST 16 15 - 41 U/L   ALT 16 (L) 17 - 63 U/L   Alkaline Phosphatase 49 38 - 126 U/L   Total Bilirubin 0.3 0.3 - 1.2 mg/dL   GFR calc non Af Amer 48 (L) >60 mL/min   GFR calc Af Amer 55 (L) >60 mL/min   Anion gap 8 5 - 15  CBC  Result Value Ref Range   WBC 7.5 4.0 - 10.5 K/uL   RBC 4.26 4.22 - 5.81 MIL/uL   Hemoglobin 11.0 (L) 13.0 - 17.0 g/dL   HCT 34.5 (L) 39.0 - 52.0 %   MCV 81.0 78.0 - 100.0 fL   MCH 25.8 (L) 26.0 - 34.0 pg   MCHC 31.9 30.0 - 36.0 g/dL   RDW 15.5 11.5 - 15.5 %   Platelets 314 150 - 400 K/uL  I-Stat Chem 8, ED  Result Value Ref Range   Sodium 141 135 - 145 mmol/L   Potassium 4.4 3.5 - 5.1 mmol/L   Chloride 105 101 - 111 mmol/L   BUN 20 6 - 20 mg/dL   Creatinine, Ser 1.90 (H) 0.61 - 1.24 mg/dL   Glucose, Bld 106 (H) 65 - 99 mg/dL   Calcium, Ion 1.17 1.15 - 1.40 mmol/L   TCO2 30 0 - 100 mmol/L   Hemoglobin 12.2 (L) 13.0 - 17.0 g/dL   HCT 36.0 (L) 39.0 - 52.0 %   No results found.  Procedures Procedures  (including critical care time)  Medications Ordered in ED  Medications  sodium chloride 0.9 % bolus 500 mL (0 mLs Intravenous Stopped 04/24/16 0112)  ondansetron (ZOFRAN) injection 4 mg (4 mg Intravenous Given 04/24/16 0041)  dicyclomine (BENTYL) injection 20 mg (20 mg Intramuscular Given 04/24/16 0041)  gi cocktail (Maalox,Lidocaine,Donnatal) (30 mLs Oral Given 04/24/16 0041)  sodium chloride 0.9 % bolus 1,000 mL (1,000 mLs Intravenous New Bag/Given 04/24/16 0112)     Final Clinical Impressions(s) / ED Diagnoses  Nausea vomiting and diarrhea:   Follow up with your PMD.  Exam and vitals are benign and reassuring.  Return for fevers, intractable vomiting or pain or any concerns.   After history, exam, and medical workup I feel the patient has been appropriately medically screened and is safe for discharge home. Pertinent diagnoses were discussed with the patient. Patient was given return precautions.  I personally performed the services described in this documentation, which was scribed in my presence. The recorded information has been reviewed and is accurate.       Veatrice Kells, MD 04/24/16 [phone]

## 2016-04-23 NOTE — ED Triage Notes (Signed)
Per pt he was eating a chicken breast at [facility] that he believes was not fully cooked.  Pt states upon leaving the restaurant he developed generalized abdominal pain w/ 1 episode of emesis.  Pt rates pain 7/10, cramping in nature.

## 2016-04-24 ENCOUNTER — Encounter (HOSPITAL_COMMUNITY): Payer: Self-pay | Admitting: Emergency Medicine

## 2016-04-24 LAB — CBC
HCT: 34.5 % — ABNORMAL LOW (ref 39.0–52.0)
Hemoglobin: 11 g/dL — ABNORMAL LOW (ref 13.0–17.0)
MCH: 25.8 pg — AB (ref 26.0–34.0)
MCHC: 31.9 g/dL (ref 30.0–36.0)
MCV: 81 fL (ref 78.0–100.0)
PLATELETS: 314 10*3/uL (ref 150–400)
RBC: 4.26 MIL/uL (ref 4.22–5.81)
RDW: 15.5 % (ref 11.5–15.5)
WBC: 7.5 10*3/uL (ref 4.0–10.5)

## 2016-04-24 LAB — COMPREHENSIVE METABOLIC PANEL
ALK PHOS: 49 U/L (ref 38–126)
ALT: 16 U/L — AB (ref 17–63)
ANION GAP: 8 (ref 5–15)
AST: 16 U/L (ref 15–41)
Albumin: 3.9 g/dL (ref 3.5–5.0)
BUN: 16 mg/dL (ref 6–20)
CALCIUM: 8.8 mg/dL — AB (ref 8.9–10.3)
CHLORIDE: 105 mmol/L (ref 101–111)
CO2: 27 mmol/L (ref 22–32)
CREATININE: 1.69 mg/dL — AB (ref 0.61–1.24)
GFR, EST AFRICAN AMERICAN: 55 mL/min — AB (ref >60)
GFR, EST NON AFRICAN AMERICAN: 48 mL/min — AB (ref >60)
GLUCOSE: 109 mg/dL — AB (ref 65–99)
Potassium: 4.1 mmol/L (ref 3.5–5.1)
SODIUM: 140 mmol/L (ref 135–145)
Total Bilirubin: 0.3 mg/dL (ref 0.3–1.2)
Total Protein: 7 g/dL (ref 6.5–8.1)

## 2016-04-24 LAB — I-STAT CHEM 8, ED
BUN: 20 mg/dL (ref 6–20)
CALCIUM ION: 1.17 mmol/L (ref 1.15–1.40)
CHLORIDE: 105 mmol/L (ref 101–111)
Creatinine, Ser: 1.9 mg/dL — ABNORMAL HIGH (ref 0.61–1.24)
GLUCOSE: 106 mg/dL — AB (ref 65–99)
HCT: 36 % — ABNORMAL LOW (ref 39.0–52.0)
HEMOGLOBIN: 12.2 g/dL — AB (ref 13.0–17.0)
Potassium: 4.4 mmol/L (ref 3.5–5.1)
Sodium: 141 mmol/L (ref 135–145)
TCO2: 30 mmol/L (ref 0–100)

## 2016-04-24 LAB — LIPASE, BLOOD: Lipase: 24 U/L (ref 11–51)

## 2016-04-24 MED ORDER — DICYCLOMINE HCL 20 MG PO TABS: 20.0000 mg | ORAL_TABLET | Freq: Two times a day (BID) | ORAL | 0 refills | Status: AC

## 2016-04-24 MED ORDER — SODIUM CHLORIDE 0.9 % IV BOLUS (SEPSIS)
500.0000 mL | Freq: Once | INTRAVENOUS | Status: AC
  Administered 2016-04-24: 500 mL via INTRAVENOUS

## 2016-04-24 MED ORDER — DICYCLOMINE HCL 10 MG/ML IM SOLN
20.0000 mg | Freq: Once | INTRAMUSCULAR | Status: AC
  Administered 2016-04-24: 20 mg via INTRAMUSCULAR
  Filled 2016-04-24: qty 2

## 2016-04-24 MED ORDER — ONDANSETRON HCL 4 MG/2ML IJ SOLN
4.0000 mg | Freq: Once | INTRAMUSCULAR | Status: AC
  Administered 2016-04-24: 4 mg via INTRAVENOUS
  Filled 2016-04-24: qty 2

## 2016-04-24 MED ORDER — SODIUM CHLORIDE 0.9 % IV BOLUS (SEPSIS)
1000.0000 mL | Freq: Once | INTRAVENOUS | Status: AC
  Administered 2016-04-24: 1000 mL via INTRAVENOUS

## 2016-04-24 MED ORDER — ONDANSETRON 8 MG PO TBDP: ORAL_TABLET | ORAL | 0 refills | Status: AC

## 2016-04-24 MED ORDER — GI COCKTAIL ~~LOC~~
30.0000 mL | Freq: Once | ORAL | Status: AC
  Administered 2016-04-24: 30 mL via ORAL
  Filled 2016-04-24: qty 30

## 2017-03-13 ENCOUNTER — Emergency Department (HOSPITAL_BASED_OUTPATIENT_CLINIC_OR_DEPARTMENT_OTHER): Payer: 59

## 2017-03-13 ENCOUNTER — Encounter (HOSPITAL_BASED_OUTPATIENT_CLINIC_OR_DEPARTMENT_OTHER): Payer: Self-pay | Admitting: Emergency Medicine

## 2017-03-13 ENCOUNTER — Emergency Department (HOSPITAL_BASED_OUTPATIENT_CLINIC_OR_DEPARTMENT_OTHER)
Admission: EM | Admit: 2017-03-13 | Discharge: 2017-03-13 | Disposition: A | Discharge status: Home / Self Care | Payer: 59 | Attending: Emergency Medicine | Admitting: Emergency Medicine

## 2017-03-13 ENCOUNTER — Other Ambulatory Visit: Payer: Self-pay

## 2017-03-13 DIAGNOSIS — I10 Essential (primary) hypertension: Secondary | ICD-10-CM | POA: Insufficient documentation

## 2017-03-13 DIAGNOSIS — M79671 Pain in right foot: Secondary | ICD-10-CM | POA: Diagnosis present

## 2017-03-13 DIAGNOSIS — Z79899 Other long term (current) drug therapy: Secondary | ICD-10-CM | POA: Insufficient documentation

## 2017-03-13 MED ORDER — INDOMETHACIN 25 MG PO CAPS: 25.0000 mg | ORAL_CAPSULE | Freq: Three times a day (TID) | ORAL | 0 refills | Status: AC | PRN

## 2017-03-13 NOTE — ED Triage Notes (Signed)
Pt reports he woke up with R foot pain across the bottom of his foot to the outer portion. Denies injury.

## 2017-03-13 NOTE — ED Notes (Signed)
ED Provider at bedside. 

## 2017-03-14 NOTE — ED Provider Notes (Signed)
[facility] EMERGENCY DEPARTMENT Provider Note   CSN: [id number] Arrival date & time: 03/13/17  1723     History   Chief Complaint Chief Complaint  Patient presents with  . Foot Pain    HPI Bradley Wells is a 47 y.o. male.  HPI   47yo male presents with concern for right foot pain. Woke up with severe foot pain this morning. Throbbing on the lateral side of his right foot.  No history of similar, no hx of gout. No leg pain or swelling. No fevers. No hx of injury.  Did recently change to new shoes.  Pain worse when getting up this AM but continued to worsen throughout the day.    Past Medical History:  Diagnosis Date  . Abdominal pain, unspecified site   . Acute upper respiratory infections of unspecified site   . Allergic rhinitis, cause unspecified   . Anal or rectal pain   . Anxiety state, unspecified   . Benign tumor of breast    bilaterl; removed as teen   . Calculus of gallbladder without mention of cholecystitis or obstruction   . Chest pain, unspecified   . Diverticulosis of colon (without mention of hemorrhage)   . Obesity, unspecified   . Pain in joint, ankle and foot    left  . Pain in joint, shoulder region    right  . Pain in limb    left foot  . Special screening for malignant neoplasms of other sites   . Syncope and collapse   . Unspecified essential hypertension   . Unspecified gastritis and gastroduodenitis without mention of hemorrhage    mild  . Unspecified sleep apnea     Patient Active Problem List   Diagnosis Date Noted  . KNEE PAIN, RIGHT 07/04/2008  . SHOULDER PAIN, RIGHT 05/21/2008  . ALLERGIC RHINITIS CAUSE UNSPECIFIED 03/28/2008  . ANXIETY 11/15/2007  . URI 09/28/2007  . ABDOMINAL PAIN 09/28/2007  . RECTAL PAIN 05/26/2007  . SYNCOPE 05/26/2007  . FOOT PAIN, LEFT 02/03/2007  . OBESITY 01/18/2007  . ANKLE PAIN, LEFT 01/18/2007  . HYPERTENSION 07/29/2006  . DIVERTICULOSIS, COLON 07/29/2006  . CHOLELITHIASIS  07/29/2006  . SLEEP APNEA 07/29/2006  . CHEST PAIN 07/29/2006    Past Surgical History:  Procedure Laterality Date  . abd gallstones  9/22-28/06   [facility]  . CARDIAC CATHETERIZATION  10/10/07   coronaries clean, EF  40-45%  . CHOLECYSTECTOMY  9/06   laprascopic  . COLONOSCOPY  9/06   negative  . CT SCAN  8/06   abd/pelvis  . ESOPHAGOGASTRODUODENOSCOPY  9/06  . MRI  09/03/03   MRI Cspine and Tspine negative  . SHOULDER ARTHROSCOPY  11/19/08   Dalldorf   . u/s abd  9/06  . UGI series  9/06   with follow-through       Home Medications    Prior to Admission medications   Medication Sig Start Date End Date Taking? Authorizing Provider  amLODipine (NORVASC) 5 MG tablet Take 1 tablet (5 mg total) by mouth daily. 06/23/15  Yes Carmin Muskrat, MD  Cholecalciferol (VITAMIN D PO) Take 1 tablet by mouth daily.    [provider]  dicyclomine (BENTYL) 20 MG tablet Take 1 tablet (20 mg total) by mouth 2 (two) times daily. 04/24/16   Palumbo, April, MD  indomethacin (INDOCIN) 25 MG capsule Take 1 capsule (25 mg total) by mouth 3 (three) times daily as needed. Take 50mg  (2 tablets) orally 3 times daily until  pain is tolerated then decrease quickly based on pain response-can stop completely or decrease to one tablet as needed three times daily.  Do not recommend taking for more than 2 weeks without consultation with your doctor. 03/13/17   Gareth Morgan, MD  Multiple Vitamins-Minerals (MULTIVITAMIN WITH MINERALS) tablet Take 1 tablet by mouth daily.    [provider]  ondansetron (ZOFRAN ODT) 8 MG disintegrating tablet 8mg  ODT q8 hours prn nausea 04/24/16   Palumbo, April, MD    Family History Family History  Problem Relation Age of Onset  . Diabetes Brother        and sister - DM2    Social History Social History   Tobacco Use  . Smoking status: Never Smoker  . Smokeless tobacco: Never Used  . Tobacco comment: does not smoke  Substance Use Topics  . Alcohol use:  No  . Drug use: No     Allergies   Hydromorphone hcl; Morphine sulfate; Other; and Coconut oil   Review of Systems Review of Systems  Constitutional: Negative for fever.  HENT: Negative for sore throat.   Respiratory: Negative for shortness of breath.   Cardiovascular: Negative for chest pain.  Gastrointestinal: Negative for abdominal pain.  Genitourinary: Negative for difficulty urinating.  Musculoskeletal: Positive for arthralgias and gait problem.  Skin: Negative for rash.  Neurological: Negative for syncope and headaches.     Physical Exam Updated Vital Signs BP (!) 153/90 (BP Location: Right Arm)   Pulse 78   Temp 98.3 F (36.8 C) (Oral)   Resp 20   Ht 6' 3.5" (1.918 m)   Wt 122.5 kg (270 lb)   SpO2 100%   BMI 33.30 kg/m   Physical Exam  Constitutional: He is oriented to person, place, and time. He appears well-developed and well-nourished. No distress.  HENT:  Head: Normocephalic and atraumatic.  Eyes: Conjunctivae and EOM are normal.  Neck: Normal range of motion.  Cardiovascular: Normal rate, regular rhythm and intact distal pulses. Exam reveals no friction rub.  Pulmonary/Chest: Effort normal. No respiratory distress.  Musculoskeletal: He exhibits tenderness (lateral side of right foot and plantar surface, no fluctuance, no erythema, no swelling). He exhibits no edema.  Neurological: He is alert and oriented to person, place, and time.  Skin: Skin is warm and dry. He is not diaphoretic.  Nursing note and vitals reviewed.    ED Treatments / Results  Labs (all labs ordered are listed, but only abnormal results are displayed) Labs Reviewed - No data to display  EKG  EKG Interpretation None       Radiology Dg Foot Complete Right  Result Date: 03/13/2017 CLINICAL DATA:  C/o pain in Rt lateral foot and also in arch of foot since last night w/no known injury EXAM: RIGHT FOOT COMPLETE - 3+ VIEW COMPARISON:  None. FINDINGS: There is no evidence of  fracture or dislocation. There is no evidence of arthropathy or other focal bone abnormality. Soft tissues are unremarkable. IMPRESSION: Negative. Electronically Signed   By: Lajean Manes M.D.   On: 03/13/2017 18:25    Procedures Procedures (including critical care time)  Medications Ordered in ED Medications - No data to display   Initial Impression / Assessment and Plan / ED Course  I have reviewed the triage vital signs and the nursing notes.  Pertinent labs & imaging results that were available during my care of the patient were reviewed by me and considered in my medical decision making (see chart for details).  47yo male presents with concern for atraumatic right foot pain.  XR without acute abnormalities.  No sign of cellulitis, abscess or septic arthritis.  Would be unusual location for gout. Possible plantar fasciitis although gout on differential. Recommend comfortable supportive foot wear, rolling bottle on bottom of foot and gave rx indomethacin.  Recommend PCP or podiatry follow up.   Final Clinical Impressions(s) / ED Diagnoses   Final diagnoses:  Right foot pain    ED Discharge Orders        Ordered    indomethacin (INDOCIN) 25 MG capsule  3 times daily PRN     03/13/17 2137       Gareth Morgan, MD 03/14/17 1106

## 2017-07-12 IMAGING — CR DG CHEST 2V
2 series · 2 of 2 positions shown · non-contrast
Comparison: 11/05/2008

CLINICAL DATA: Syncope this morning, fall, hit head on the floor

EXAM:
CHEST  2 VIEW

[w chest pa]
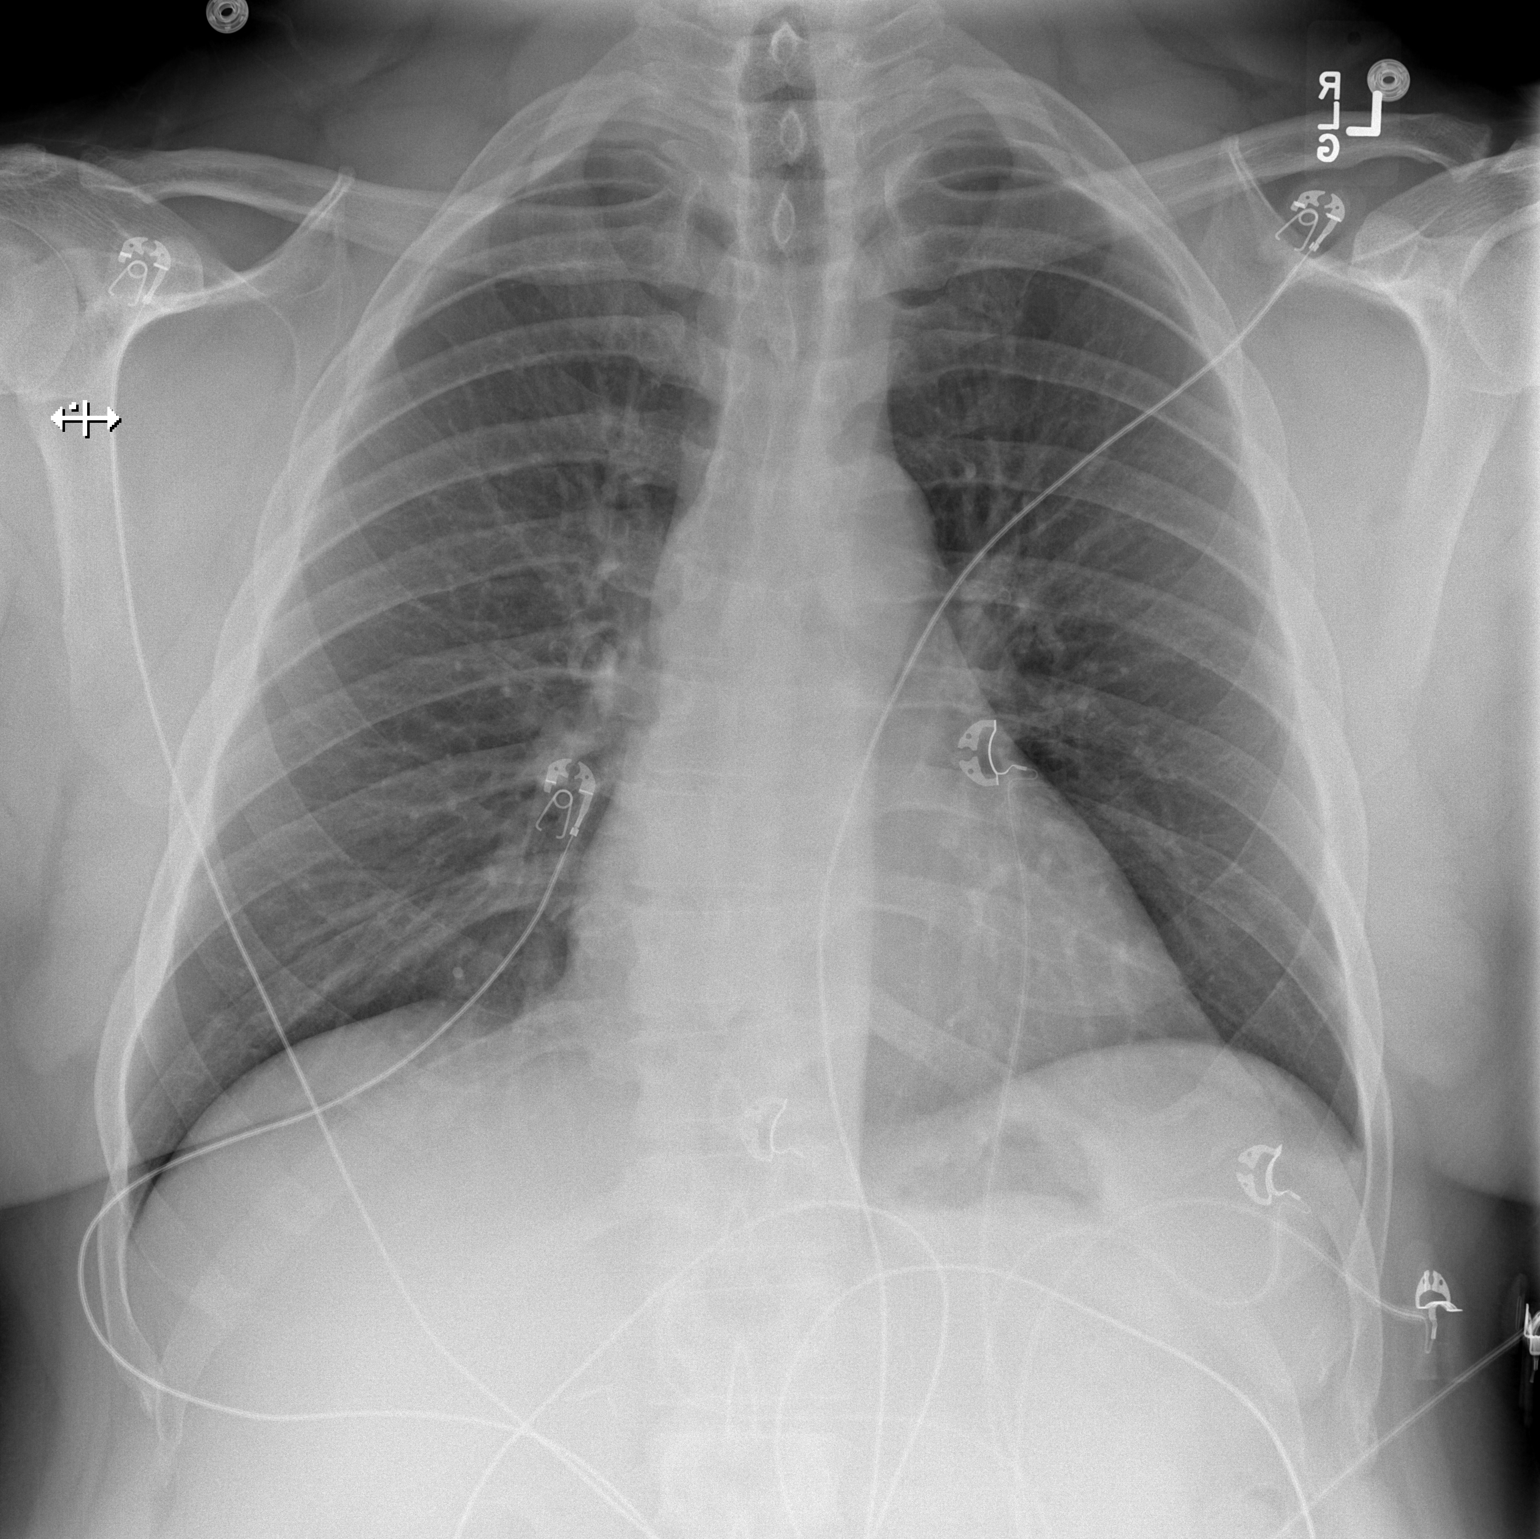

[w chest lat]
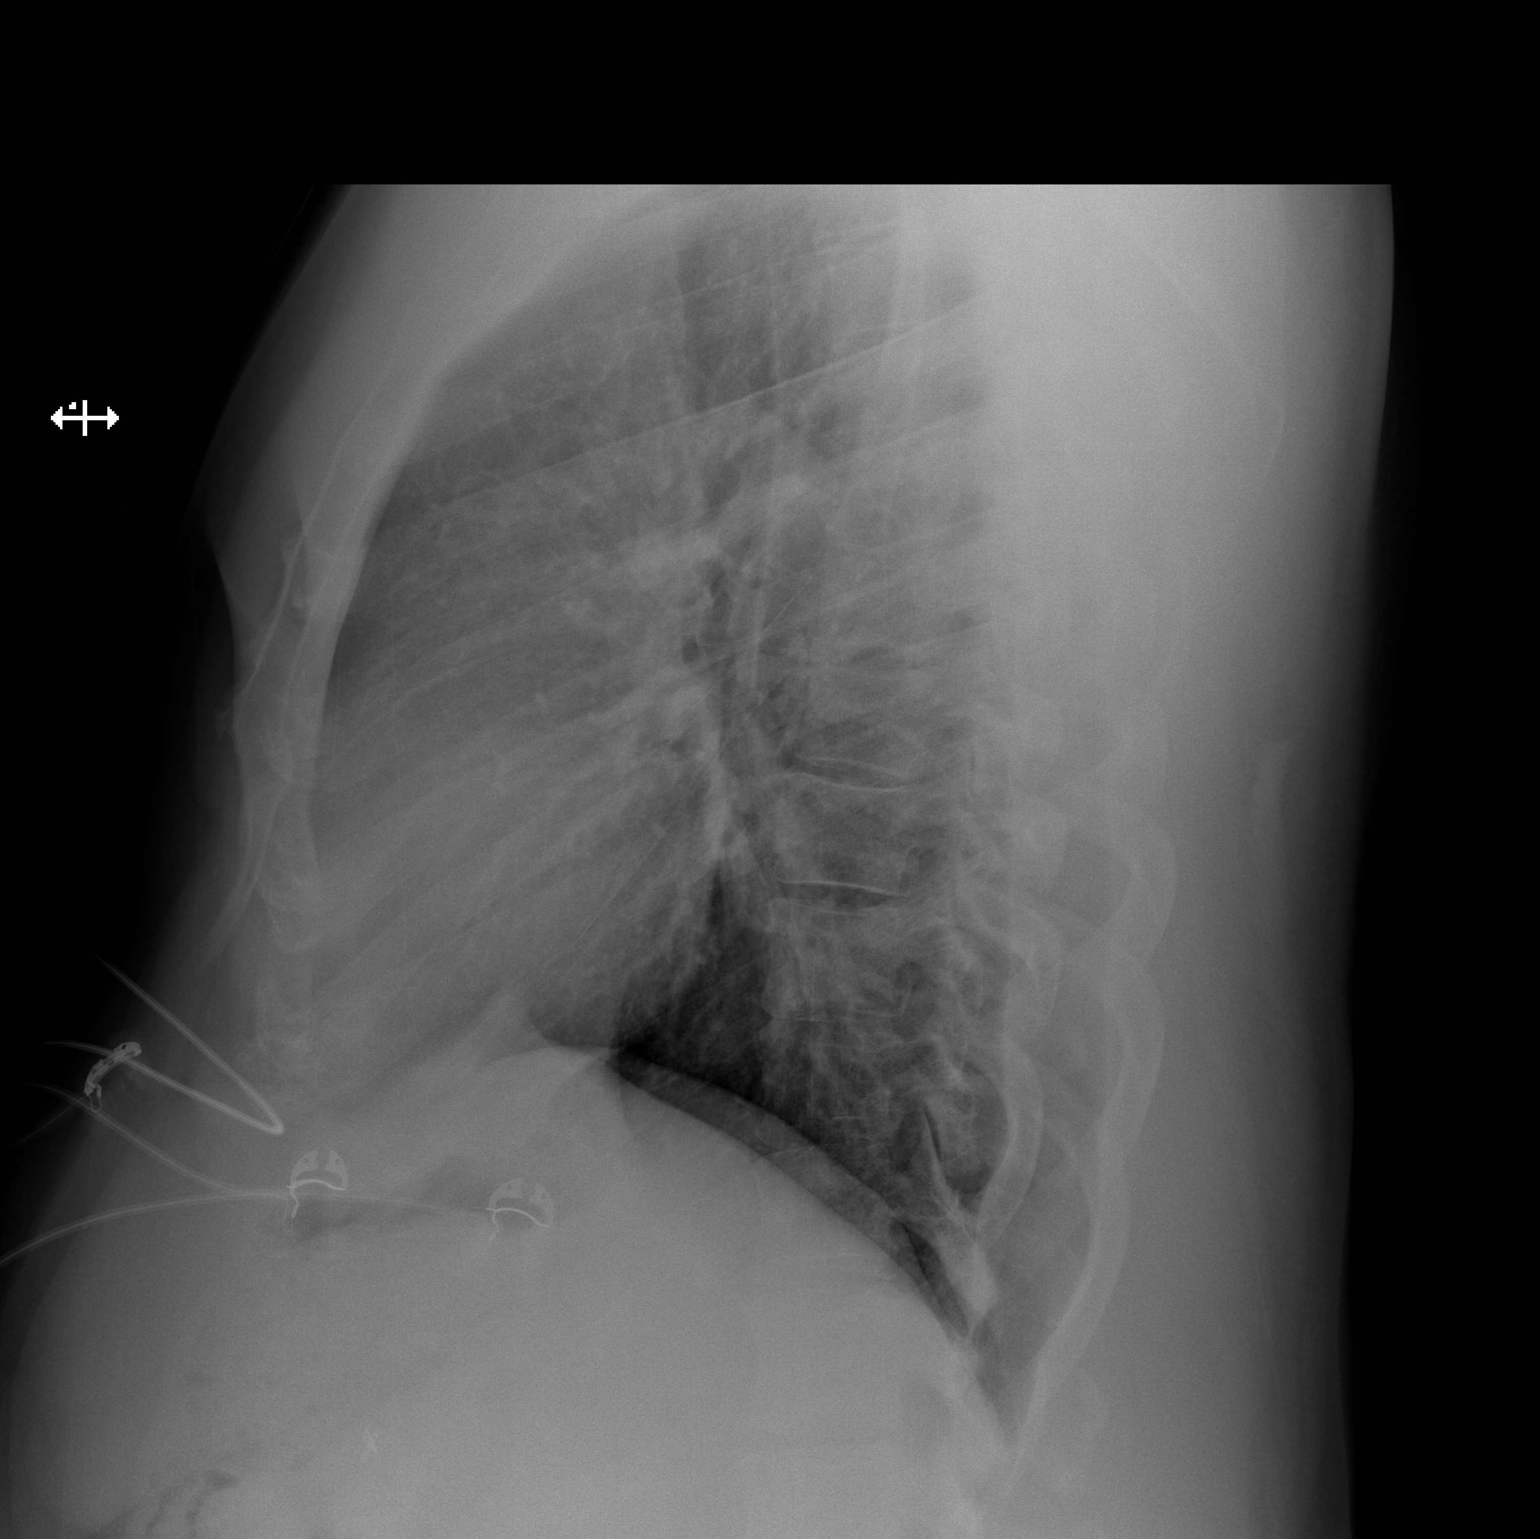

[2 of 2 positions shown; findings below may reference images not displayed]

FINDINGS: Cardiomediastinal silhouette is stable. No acute infiltrate or
pleural effusion. No pulmonary edema. Bony thorax is unremarkable.
IMPRESSION: No active cardiopulmonary disease.

## 2019-04-27 ENCOUNTER — Emergency Department (HOSPITAL_COMMUNITY)
Admission: EM | Admit: 2019-04-27 | Discharge: 2019-04-27 | Disposition: A | Discharge status: Home / Self Care | Payer: 59 | Attending: Emergency Medicine | Admitting: Emergency Medicine

## 2019-04-27 ENCOUNTER — Other Ambulatory Visit: Payer: Self-pay

## 2019-04-27 DIAGNOSIS — T7840XA Allergy, unspecified, initial encounter: Secondary | ICD-10-CM

## 2019-04-27 DIAGNOSIS — R0989 Other specified symptoms and signs involving the circulatory and respiratory systems: Secondary | ICD-10-CM | POA: Insufficient documentation

## 2019-04-27 DIAGNOSIS — I10 Essential (primary) hypertension: Secondary | ICD-10-CM | POA: Insufficient documentation

## 2019-04-27 MED ORDER — DEXAMETHASONE SODIUM PHOSPHATE 10 MG/ML IJ SOLN
10.0000 mg | Freq: Once | INTRAMUSCULAR | Status: AC
  Administered 2019-04-27: 10 mg via INTRAMUSCULAR
  Filled 2019-04-27: qty 1

## 2019-04-27 MED ORDER — DIPHENHYDRAMINE HCL 25 MG PO CAPS
25.0000 mg | ORAL_CAPSULE | Freq: Once | ORAL | Status: AC
  Administered 2019-04-27: 25 mg via ORAL
  Filled 2019-04-27: qty 1

## 2019-04-27 NOTE — ED Triage Notes (Addendum)
Pt states he ate scallops at dinner and has not been feeling well since then. Reports he feels nauseous, dizzy, and lightheaded. Denies SOB but states his throat feels irritated. One episode of vomiting in triage.

## 2019-04-27 NOTE — ED Provider Notes (Addendum)
[facility] DEPT Provider Note   CSN: [id number] Arrival date & time: 04/27/19  2058     History No chief complaint on file.   Bradley Wells is a 49 y.o. male.  Patient presents to the emergency department with a chief complaint of allergic reaction.  He states that he was out with his wife on a date tonight and ate scallops in "fish sauce" for the first time tonight.  He states that shortly thereafter he got a little lightheaded had some itchy sensation in his throat, and felt nauseated.  He had one episode of vomiting in the emergency department waiting room.  He denies having had any shortness of breath.  Denies having any skin rash.  He denies having had any reactions like this in the past.  He states that he generally only eats shrimp and salmon with regard to eating seafood.  He denies any treatments prior to arrival.  He states that now he feels fine.  Reports that he feels back to normal.  The history is provided by the patient. No language interpreter was used.       Past Medical History:  Diagnosis Date  . Abdominal pain, unspecified site   . Acute upper respiratory infections of unspecified site   . Allergic rhinitis, cause unspecified   . Anal or rectal pain   . Anxiety state, unspecified   . Benign tumor of breast    bilaterl; removed as teen   . Calculus of gallbladder without mention of cholecystitis or obstruction   . Chest pain, unspecified   . Diverticulosis of colon (without mention of hemorrhage)   . Obesity, unspecified   . Pain in joint, ankle and foot    left  . Pain in joint, shoulder region    right  . Pain in limb    left foot  . Special screening for malignant neoplasms of other sites   . Syncope and collapse   . Unspecified essential hypertension   . Unspecified gastritis and gastroduodenitis without mention of hemorrhage    mild  . Unspecified sleep apnea     Patient Active Problem List   Diagnosis Date  Noted  . KNEE PAIN, RIGHT 07/04/2008  . SHOULDER PAIN, RIGHT 05/21/2008  . ALLERGIC RHINITIS CAUSE UNSPECIFIED 03/28/2008  . ANXIETY 11/15/2007  . URI 09/28/2007  . ABDOMINAL PAIN 09/28/2007  . RECTAL PAIN 05/26/2007  . SYNCOPE 05/26/2007  . FOOT PAIN, LEFT 02/03/2007  . OBESITY 01/18/2007  . ANKLE PAIN, LEFT 01/18/2007  . HYPERTENSION 07/29/2006  . DIVERTICULOSIS, COLON 07/29/2006  . CHOLELITHIASIS 07/29/2006  . SLEEP APNEA 07/29/2006  . CHEST PAIN 07/29/2006    Past Surgical History:  Procedure Laterality Date  . abd gallstones  9/22-28/06   [facility]  . CARDIAC CATHETERIZATION  10/10/07   coronaries clean, EF  40-45%  . CHOLECYSTECTOMY  9/06   laprascopic  . COLONOSCOPY  9/06   negative  . CT SCAN  8/06   abd/pelvis  . ESOPHAGOGASTRODUODENOSCOPY  9/06  . MRI  09/03/03   MRI Cspine and Tspine negative  . SHOULDER ARTHROSCOPY  11/19/08   [facility]orf   . u/s abd  9/06  . UGI series  9/06   with follow-through       Family History  Problem Relation Age of Onset  . Diabetes Brother        and sister - DM2    Social History   Tobacco Use  . Smoking status: Never Smoker  .  Smokeless tobacco: Never Used  . Tobacco comment: does not smoke  Substance Use Topics  . Alcohol use: No  . Drug use: No    Home Medications Prior to Admission medications   Medication Sig Start Date End Date Taking? Authorizing Provider  amLODipine (NORVASC) 5 MG tablet Take 1 tablet (5 mg total) by mouth daily. 06/23/15  Yes Carmin Muskrat, MD  Cholecalciferol (VITAMIN D PO) Take 1 tablet by mouth daily.   Yes [provider]  Multiple Vitamins-Minerals (MULTIVITAMIN WITH MINERALS) tablet Take 1 tablet by mouth daily.   Yes [provider]  dicyclomine (BENTYL) 20 MG tablet Take 1 tablet (20 mg total) by mouth 2 (two) times daily. Patient not taking: Reported on 04/27/2019 04/24/16   Palumbo, April, MD  indomethacin (INDOCIN) 25 MG capsule Take 1 capsule (25 mg total) by  mouth 3 (three) times daily as needed. Take 50mg  (2 tablets) orally 3 times daily until pain is tolerated then decrease quickly based on pain response-can stop completely or decrease to one tablet as needed three times daily.  Do not recommend taking for more than 2 weeks without consultation with your doctor. Patient not taking: Reported on 04/27/2019 03/13/17   Gareth Morgan, MD  ondansetron [facility] ODT) 8 MG disintegrating tablet 8mg  ODT q8 hours prn nausea Patient not taking: Reported on 04/27/2019 04/24/16   Palumbo, April, MD    Allergies    Hydromorphone hcl, Morphine sulfate, Other, and Coconut oil  Review of Systems   Review of Systems  All other systems reviewed and are negative.   Physical Exam Updated Vital Signs BP (!) 153/86   Pulse 78   Temp 98 F (36.7 C) (Oral)   Resp 19   SpO2 99%   Physical Exam Vitals and nursing note reviewed.  Constitutional:      Appearance: He is well-developed.  HENT:     Head: Normocephalic and atraumatic.     Mouth/Throat:     Comments: Oropharynx is clear, no erythema, no edema, no stridor Eyes:     Conjunctiva/sclera: Conjunctivae normal.  Cardiovascular:     Rate and Rhythm: Normal rate and regular rhythm.     Heart sounds: No murmur.  Pulmonary:     Effort: Pulmonary effort is normal. No respiratory distress.     Breath sounds: Normal breath sounds.     Comments: Lung sounds are clear Abdominal:     Palpations: Abdomen is soft.     Tenderness: There is no abdominal tenderness.  Musculoskeletal:     Cervical back: Neck supple.  Skin:    General: Skin is warm and dry.     Comments: No skin rash  Neurological:     Mental Status: He is alert and oriented to person, place, and time.  Psychiatric:        Mood and Affect: Mood normal.        Behavior: Behavior normal.     ED Results / Procedures / Treatments   Labs (all labs ordered are listed, but only abnormal results are displayed) Labs Reviewed - No data to  display  EKG None ED ECG REPORT  I personally interpreted this EKG   Date: 04/27/2019   Rate: 91  Rhythm: normal sinus rhythm  QRS Axis: normal  Intervals: normal  ST/T Wave abnormalities: slightly more pronounced ST depressions inferiorly when compared to 2017  Conduction Disutrbances:none  Narrative Interpretation:   Old EKG Reviewed: changes noted   Radiology No results found.  Procedures Procedures (  including critical care time)  Medications Ordered in ED Medications  dexamethasone (DECADRON) injection 10 mg (has no administration in time range)  diphenhydrAMINE (BENADRYL) capsule 25 mg (has no administration in time range)    ED Course  I have reviewed the triage vital signs and the nursing notes.  Pertinent labs & imaging results that were available during my care of the patient were reviewed by me and considered in my medical decision making (see chart for details).    MDM Rules/Calculators/A&P                      Patient here with allergic reaction, suspicious seafood encounter tonight.  He reports that he is feeling better now.  His symptoms include an itchy throat, some lightheadedness and one episode of vomiting.  He did not have any skin rash.  He states that he feels normal now.  He has no difficulty speaking.  His lungs are clear to auscultation.  Clinically, he is very well-appearing.  I will give him a dose of IM Decadron and a dose of Benadryl.  Will encourage patient to take Benadryl for the next day or 2, but I do not think that he requires any further observation/monitoring or work-up tonight.  Patient understands and agrees with this plan.  I find him stable and ready for discharge.  EKG and case reviewed with Dr. Tamera Punt.  No cardiac symptoms tonight.  I doubt ACS etiology.  Final Clinical Impression(s) / ED Diagnoses Final diagnoses:  Allergic reaction, initial encounter    Rx / DC Orders ED Discharge Orders    None       Montine Circle,  PA-C 04/27/19 2311    Montine Circle, PA-C 04/27/19 2329    Malvin Johns, MD 04/27/19 [phone]

## 2019-04-27 NOTE — ED Notes (Signed)
EKG was completed by NT Jane at 2117.  Order number entered when exporting was incorrect and therefore EKG is not crossing over into the pt's chart.  Unable to correct order number.  Dr.Belfi was made aware of the situation and stated she could update the EKG manually in her system.  This writer called EKG lab [phone]), however they are not open at this time.  A copy of the EKG was obtained, pt label was affixed to the document, and a note was written explaining situation and included the correct EKG order number ([id number]).  Document was placed at the secretary desk in the Medical Records folder.

## 2019-04-27 NOTE — Discharge Instructions (Signed)
Your symptoms tonight are thought to have been caused by what you ate.  You were given a shot of Decadron (a steroid) and some benadryl tonight.  This should reduce allergic reaction symptoms.  If your symptoms change or worsen, please return to the ER.  Otherwise, please follow-up with your doctor and/or the allergist that is listed.
